# Patient Record
Sex: Female | Born: 1990 | Race: Black or African American | Hispanic: No | Marital: Single | Smoking: Former smoker
Health system: Southern US, Community
[De-identification: ages and names within clinical notes are randomized; demographics above are authoritative.]

## PROBLEM LIST (undated history)

## (undated) DIAGNOSIS — E041 Nontoxic single thyroid nodule: Secondary | ICD-10-CM

## (undated) DIAGNOSIS — Z803 Family history of malignant neoplasm of breast: Secondary | ICD-10-CM

## (undated) DIAGNOSIS — C50912 Malignant neoplasm of unspecified site of left female breast: Secondary | ICD-10-CM

## (undated) DIAGNOSIS — E229 Hyperfunction of pituitary gland, unspecified: Secondary | ICD-10-CM

## (undated) DIAGNOSIS — N921 Excessive and frequent menstruation with irregular cycle: Secondary | ICD-10-CM

## (undated) DIAGNOSIS — F419 Anxiety disorder, unspecified: Secondary | ICD-10-CM

## (undated) HISTORY — DX: Nontoxic single thyroid nodule: E04.1

## (undated) HISTORY — DX: Family history of malignant neoplasm of breast: Z80.3

## (undated) HISTORY — PX: MANDIBLE SURGERY: SHX707

## (undated) HISTORY — DX: Excessive and frequent menstruation with irregular cycle: N92.1

## (undated) HISTORY — DX: Anxiety disorder, unspecified: F41.9

## (undated) HISTORY — DX: Hyperfunction of pituitary gland, unspecified: E22.9

---

## 2008-07-04 ENCOUNTER — Emergency Department (HOSPITAL_BASED_OUTPATIENT_CLINIC_OR_DEPARTMENT_OTHER): Admission: EM | Admit: 2008-07-04 | Discharge: 2008-07-04 | Payer: Self-pay | Admitting: Emergency Medicine

## 2008-10-06 ENCOUNTER — Emergency Department (HOSPITAL_BASED_OUTPATIENT_CLINIC_OR_DEPARTMENT_OTHER): Admission: EM | Admit: 2008-10-06 | Discharge: 2008-10-06 | Payer: Self-pay | Admitting: Emergency Medicine

## 2009-05-24 ENCOUNTER — Encounter: Payer: Self-pay | Admitting: Emergency Medicine

## 2009-05-24 ENCOUNTER — Ambulatory Visit: Payer: Self-pay | Admitting: Diagnostic Radiology

## 2009-05-24 ENCOUNTER — Ambulatory Visit (HOSPITAL_COMMUNITY): Admission: EM | Admit: 2009-05-24 | Discharge: 2009-05-25 | Payer: Self-pay | Admitting: Emergency Medicine

## 2009-07-08 ENCOUNTER — Ambulatory Visit (HOSPITAL_COMMUNITY): Admission: RE | Admit: 2009-07-08 | Discharge: 2009-07-08 | Payer: Self-pay | Admitting: Otolaryngology

## 2009-07-26 ENCOUNTER — Ambulatory Visit (HOSPITAL_COMMUNITY): Admission: RE | Admit: 2009-07-26 | Discharge: 2009-07-26 | Payer: Self-pay | Admitting: Otolaryngology

## 2010-02-06 ENCOUNTER — Emergency Department (HOSPITAL_BASED_OUTPATIENT_CLINIC_OR_DEPARTMENT_OTHER)
Admission: EM | Admit: 2010-02-06 | Discharge: 2010-02-07 | Payer: Self-pay | Source: Home / Self Care | Admitting: Emergency Medicine

## 2010-05-12 LAB — CBC
Hemoglobin: 11.7 g/dL — ABNORMAL LOW (ref 12.0–15.0)
Platelets: 226 10*3/uL (ref 150–400)
RBC: 4.61 MIL/uL (ref 3.87–5.11)

## 2010-06-03 LAB — URINALYSIS, ROUTINE W REFLEX MICROSCOPIC
Hgb urine dipstick: NEGATIVE
Protein, ur: NEGATIVE mg/dL
pH: 6 (ref 5.0–8.0)
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
## 2012-02-24 DIAGNOSIS — R7989 Other specified abnormal findings of blood chemistry: Secondary | ICD-10-CM

## 2012-02-24 DIAGNOSIS — E041 Nontoxic single thyroid nodule: Secondary | ICD-10-CM

## 2012-02-24 HISTORY — DX: Other specified abnormal findings of blood chemistry: R79.89

## 2012-02-24 HISTORY — DX: Nontoxic single thyroid nodule: E04.1

## 2015-03-15 ENCOUNTER — Emergency Department (HOSPITAL_BASED_OUTPATIENT_CLINIC_OR_DEPARTMENT_OTHER)
Admission: EM | Admit: 2015-03-15 | Discharge: 2015-03-15 | Disposition: A | Discharge status: Home / Self Care | Payer: Self-pay | Attending: Emergency Medicine | Admitting: Emergency Medicine

## 2015-03-15 ENCOUNTER — Encounter (HOSPITAL_BASED_OUTPATIENT_CLINIC_OR_DEPARTMENT_OTHER): Payer: Self-pay | Admitting: Emergency Medicine

## 2015-03-15 ENCOUNTER — Emergency Department (HOSPITAL_BASED_OUTPATIENT_CLINIC_OR_DEPARTMENT_OTHER): Payer: Self-pay

## 2015-03-15 DIAGNOSIS — F17200 Nicotine dependence, unspecified, uncomplicated: Secondary | ICD-10-CM | POA: Insufficient documentation

## 2015-03-15 DIAGNOSIS — J029 Acute pharyngitis, unspecified: Secondary | ICD-10-CM | POA: Insufficient documentation

## 2015-03-15 DIAGNOSIS — R112 Nausea with vomiting, unspecified: Secondary | ICD-10-CM | POA: Insufficient documentation

## 2015-03-15 DIAGNOSIS — J45909 Unspecified asthma, uncomplicated: Secondary | ICD-10-CM | POA: Insufficient documentation

## 2015-03-15 DIAGNOSIS — H73893 Other specified disorders of tympanic membrane, bilateral: Secondary | ICD-10-CM | POA: Insufficient documentation

## 2015-03-15 DIAGNOSIS — R6889 Other general symptoms and signs: Secondary | ICD-10-CM

## 2015-03-15 DIAGNOSIS — H04203 Unspecified epiphora, bilateral lacrimal glands: Secondary | ICD-10-CM | POA: Insufficient documentation

## 2015-03-15 LAB — RAPID STREP SCREEN (MED CTR MEBANE ONLY): STREPTOCOCCUS, GROUP A SCREEN (DIRECT): NEGATIVE

## 2015-03-15 MED ORDER — ALBUTEROL SULFATE HFA 108 (90 BASE) MCG/ACT IN AERS
2.0000 | INHALATION_SPRAY | RESPIRATORY_TRACT | Status: DC | PRN
  Administered 2015-03-15: 2 via RESPIRATORY_TRACT
  Filled 2015-03-15: qty 6.7

## 2015-03-15 MED ORDER — PROMETHAZINE-DM 6.25-15 MG/5ML PO SYRP: 5.0000 mL | ORAL_SOLUTION | Freq: Four times a day (QID) | ORAL | Status: DC | PRN

## 2015-03-15 MED ORDER — GUAIFENESIN 100 MG/5ML PO LIQD: 100.0000 mg | ORAL | Status: DC | PRN

## 2015-03-15 NOTE — Discharge Instructions (Signed)
Viral Infections °A viral infection can be caused by different types of viruses. Most viral infections are not serious and resolve on their own. However, some infections may cause severe symptoms and may lead to further complications. °SYMPTOMS °Viruses can frequently cause: °· Minor sore throat. °· Aches and pains. °· Headaches. °· Runny nose. °· Different types of rashes. °· Watery eyes. °· Tiredness. °· Cough. °· Loss of appetite. °· Gastrointestinal infections, resulting in nausea, vomiting, and diarrhea. °These symptoms do not respond to antibiotics because the infection is not caused by bacteria. However, you might catch a bacterial infection following the viral infection. This is sometimes called a "superinfection." Symptoms of such a bacterial infection may include: °· Worsening sore throat with pus and difficulty swallowing. °· Swollen neck glands. °· Chills and a high or persistent fever. °· Severe headache. °· Tenderness over the sinuses. °· Persistent overall ill feeling (malaise), muscle aches, and tiredness (fatigue). °· Persistent cough. °· Yellow, green, or brown mucus production with coughing. °HOME CARE INSTRUCTIONS  °· Only take over-the-counter or prescription medicines for pain, discomfort, diarrhea, or fever as directed by your caregiver. °· Drink enough water and fluids to keep your urine clear or pale yellow. Sports drinks can provide valuable electrolytes, sugars, and hydration. °· Get plenty of rest and maintain proper nutrition. Soups and broths with crackers or rice are fine. °SEEK IMMEDIATE MEDICAL CARE IF:  °· You have severe headaches, shortness of breath, chest pain, neck pain, or an unusual rash. °· You have uncontrolled vomiting, diarrhea, or you are unable to keep down fluids. °· You or your child has an oral temperature above 102° F (38.9° C), not controlled by medicine. °· Your baby is older than 3 months with a rectal temperature of 102° F (38.9° C) or higher. °· Your baby is 3  months old or younger with a rectal temperature of 100.4° F (38° C) or higher. °MAKE SURE YOU:  °· Understand these instructions. °· Will watch your condition. °· Will get help right away if you are not doing well or get worse. °  °This information is not intended to replace advice given to you by your health care provider. Make sure you discuss any questions you have with your health care provider. °  °Document Released: 11/19/2004 Document Revised: 05/04/2011 Document Reviewed: 07/18/2014 °Elsevier Interactive Patient Education ©2016 [employer] ° °

## 2015-03-15 NOTE — ED Provider Notes (Signed)
CSN: [id number]     Arrival date & time 03/15/15  1600 History   First MD Initiated Contact with Patient 03/15/15 1713     Chief Complaint  Patient presents with  . Cough     (Consider location/radiation/quality/duration/timing/severity/associated sxs/prior Treatment) HPI    25 year old female presents complaining of cold and fu like sxs. For the past 6 days pt has had watery eyes, runny nose, sore throat, dysphagia, productive cough, nausea, vomited once today and having subjective fever.  Cough has been persistent with blood streak in mucous.  Pt is a smoker, no recent travel.  Hx of asthma but has not need inhaler x 5 years but report having mild wheezing.  Has tried DayQuil and Nyquil with some improvement.  Sick contact at work.  No flu shot.      History reviewed. No pertinent past medical history. History reviewed. No pertinent past surgical history. History reviewed. No pertinent family history. Social History  Substance Use Topics  . Smoking status: Current Every Day Smoker  . Smokeless tobacco: None  . Alcohol Use: Yes     Comment: occ   OB History    No data available     Review of Systems  All other systems reviewed and are negative.     Allergies  Review of patient's allergies indicates no known allergies.  Home Medications   Prior to Admission medications   Not on File   BP 138/82 mmHg  Pulse 92  Temp(Src) 98.5 F (36.9 C) (Oral)  Resp 18  Ht 5\' 8"  (1.727 m)  Wt 96.163 kg  BMI 32.24 kg/m2  SpO2 100%  LMP 02/15/2015 Physical Exam  Constitutional: She appears well-developed and well-nourished. No distress.  HENT:  Head: Atraumatic.   Ears: right TM is normal, left TM is mildly erythematous and mildly bulging  Nose: normal nares  Throat: uvula is midline no tonsillar enlargement or exudates , no trismus  Eyes: Conjunctivae are normal.  Neck: Neck supple.  Cardiovascular: Normal rate and regular rhythm.   Pulmonary/Chest: Effort normal and  breath sounds normal.   Decreased breath sounds without any obvious wheezes, rales, or rhonchi heard  Abdominal: Soft. There is no tenderness.  Lymphadenopathy:    She has no cervical adenopathy.  Neurological: She is alert.  Skin: No rash noted.  Psychiatric: She has a normal mood and affect.  Nursing note and vitals reviewed.   ED Course  Procedures (including critical care time) Labs Review Labs Reviewed  RAPID STREP SCREEN (NOT AT [facility])  CULTURE, GROUP A STREP [facility])    Imaging Review Dg Chest 2 View  03/15/2015  CLINICAL DATA:  Productive cough and congestion for 1 week, initial encounter EXAM: CHEST - 2 VIEW COMPARISON:  None. FINDINGS: The heart size and mediastinal contours are within normal limits. Both lungs are clear. The visualized skeletal structures are unremarkable. IMPRESSION: No active disease. Electronically Signed   By: Inez Catalina M.D.   On: 03/15/2015 17:44   I have personally reviewed and evaluated these images and lab results as part of my medical decision-making.   EKG Interpretation None      MDM   Final diagnoses:  Flu-like symptoms    BP 138/82 mmHg  Pulse 93  Temp(Src) 98.5 F (36.9 C) (Oral)  Resp 18  Ht 5\' 8"  (1.727 m)  Wt 96.163 kg  BMI 32.24 kg/m2  SpO2 100%  LMP 02/15/2015   5:34 PM  patient presents with flulike symptoms. She is afebrile with  stable normal vital sign and no evidence of hypoxia. A rapid strep test is negative. Chest x-ray ordered. Albuterol inhaler provided.  6:08 PM  strep test is negative , chest x-ray shows no signs of pneumonia. Likely viral. Symptomatically treatment provided.  Domenic Moras, PA-C 03/15/15 1811  Noemi Chapel, MD 03/15/15 [phone]

## 2015-03-15 NOTE — ED Notes (Signed)
Patient has a had cough and congestion x 1 week. Reports this am that she has blood in her sputum and this was concerning to her

## 2015-03-15 NOTE — ED Notes (Signed)
Patient is alert and oriented x3.  She was given DC instructions and follow up visit instructions.  Patient gave verbal understanding. She was DC ambulatory under her own power to home.  V/S stable.  He was not showing any signs of distress on DC 

## 2015-03-18 LAB — CULTURE, GROUP A STREP (THRC)

## 2015-03-24 ENCOUNTER — Encounter (HOSPITAL_BASED_OUTPATIENT_CLINIC_OR_DEPARTMENT_OTHER): Payer: Self-pay | Admitting: Emergency Medicine

## 2015-03-24 ENCOUNTER — Emergency Department (HOSPITAL_BASED_OUTPATIENT_CLINIC_OR_DEPARTMENT_OTHER)
Admission: EM | Admit: 2015-03-24 | Discharge: 2015-03-24 | Disposition: A | Discharge status: Home / Self Care | Payer: Self-pay | Attending: Emergency Medicine | Admitting: Emergency Medicine

## 2015-03-24 DIAGNOSIS — R110 Nausea: Secondary | ICD-10-CM | POA: Insufficient documentation

## 2015-03-24 DIAGNOSIS — R21 Rash and other nonspecific skin eruption: Secondary | ICD-10-CM | POA: Insufficient documentation

## 2015-03-24 DIAGNOSIS — F17200 Nicotine dependence, unspecified, uncomplicated: Secondary | ICD-10-CM | POA: Insufficient documentation

## 2015-03-24 MED ORDER — CETIRIZINE HCL 10 MG PO TABS: 10.0000 mg | ORAL_TABLET | Freq: Every day | ORAL | Status: DC

## 2015-03-24 MED ORDER — PREDNISONE 50 MG PO TABS
50.0000 mg | ORAL_TABLET | Freq: Every day | ORAL | Status: DC
Start: 2015-03-24 — End: 2015-10-20

## 2015-03-24 NOTE — Discharge Instructions (Signed)
Return to the ED with any concerns including lip or tongue swelling, difficulty breathing, vomiting and not able to keep down liquids, or any other alarming symptoms

## 2015-03-24 NOTE — ED Notes (Addendum)
Pt states was seen and dx'd with flu like symptoms last Friday.  Took promethazine as rx'd mon tues and weds, stopped it on weds b/c she "felt funny."  Then on Thursday she started breaking out in a rash which has been worsening over course of the week.  Raised itchy rash over diffuse body.  Pt took benadryl earlier in the week with no relief of symptoms.

## 2015-03-24 NOTE — ED Provider Notes (Signed)
CSN: [id number]     Arrival date & time 03/24/15  1728 History  By signing my name below, I, Caroline Matthews, attest that this documentation has been prepared under the direction and in the presence of Alfonzo Beers, MD. Electronically Signed: Soijett Matthews, ED Scribe. 03/24/2015. 7:25 PM.   Chief Complaint  Patient presents with  . Rash      Patient is a 25 y.o. female presenting with rash. The history is provided by the patient. No language interpreter was used.  Rash Location:  Full body Quality: itchiness   Duration:  4 days Timing:  Constant Progression:  Worsening Chronicity:  New Context: not animal contact, not new detergent/soap and not plant contact   Relieved by:  Nothing Worsened by:  Nothing tried Ineffective treatments:  Antihistamines Associated symptoms: nausea   Associated symptoms: no shortness of breath, no sore throat and not vomiting     Caroline Matthews is a 25 y.o. female who presents to the Emergency Department complaining of worsening, pruritic, generalized rash to the body onset 3 days. Pt denies new soaps/pets/environment/lotion/detergent/food. She notes that she was seen on 03/15/15 for a cough and had a negative strep and CXR. Pt was Rx robitussin and promethazine. She states that she d/c use of the promethazine after taking it for 3 days and  "feeling funny." Pt is having associated symptoms of nausea. She notes that she has tried benadryl with no relief of her symptoms. She denies SOB, tongue swelling, sore throat, vomiting, and any other symptoms. She denies anyone near her having a rash at this time.   History reviewed. No pertinent past medical history. History reviewed. No pertinent past surgical history. No family history on file. Social History  Substance Use Topics  . Smoking status: Current Every Day Smoker  . Smokeless tobacco: None  . Alcohol Use: Yes     Comment: occ   OB History    No data available     Review of Systems  HENT: Negative  for sore throat.   Respiratory: Negative for shortness of breath.   Gastrointestinal: Positive for nausea. Negative for vomiting.  Skin: Positive for rash.  All other systems reviewed and are negative.     Allergies  Review of patient's allergies indicates no known allergies.  Home Medications   Prior to Admission medications   Medication Sig Start Date End Date Taking? Authorizing Provider  cetirizine (ZYRTEC) 10 MG tablet Take 1 tablet (10 mg total) by mouth daily. 03/24/15   Alfonzo Beers, MD  guaiFENesin (ROBITUSSIN) 100 MG/5ML liquid Take 5-10 mLs (100-200 mg total) by mouth every 4 (four) hours as needed for congestion. 03/15/15   Domenic Moras, PA-C  predniSONE (DELTASONE) 50 MG tablet Take 1 tablet (50 mg total) by mouth daily. 03/24/15   Alfonzo Beers, MD  promethazine-dextromethorphan (PROMETHAZINE-DM) 6.25-15 MG/5ML syrup Take 5 mLs by mouth 4 (four) times daily as needed for cough. 03/15/15   Domenic Moras, PA-C   BP 132/69 mmHg  Pulse 69  Temp(Src) 98.2 F (36.8 C) (Oral)  Resp 18  Ht 5\' 8"  (1.727 m)  Wt 219 lb 3.2 oz (99.428 kg)  BMI 33.34 kg/m2  SpO2 100%  LMP 03/22/2015 (Exact Date)  Vitals reviewed Physical Exam  Physical Examination: General appearance - alert, well appearing, and in no distress Mental status - alert, oriented to person, place, and time Eyes - no conjunctival injection, no scleral icterus Mouth - mucous membranes moist, pharynx normal without lesions Chest - clear to auscultation,  no wheezes, rales or rhonchi, symmetric air entry Heart - normal rate, regular rhythm, normal S1, S2, no murmurs, rubs, clicks or gallops Neurological - alert, oriented, normal speech Extremities - peripheral pulses normal, no pedal edema, no clubbing or cyanosis Skin - normal coloration and turgor, diffuse erythematous rash over arms, trunk, face, legs, no petechiae  ED Course  Procedures (including critical care time) DIAGNOSTIC STUDIES: Oxygen Saturation is 100% on  RA, nl by my interpretation.    COORDINATION OF CARE: 7:24 PM Discussed treatment plan with pt at bedside which includes continue benadryl PRN, zyrtec, steroid Rx and pt agreed to plan.    Labs Review Labs Reviewed - No data to display  Imaging Review No results found.    EKG Interpretation None      MDM   Final diagnoses:  Rash and nonspecific skin eruption    Pt presenting with diffuse itching rash over extremities, trunk, face.  Appearance is c/w hives- pt states benadryl did not help her symptoms.  Advised to continue benadryl every 6 hours, added zyrtec and 5 days course of predisone.  Discharged with strict return precautions.  Pt agreeable with plan.  I personally performed the services described in this documentation, which was scribed in my presence. The recorded information has been reviewed and is accurate.     Alfonzo Beers, MD 03/24/15 2144

## 2015-10-20 ENCOUNTER — Encounter (HOSPITAL_COMMUNITY): Payer: Self-pay | Admitting: Emergency Medicine

## 2015-10-20 ENCOUNTER — Emergency Department (HOSPITAL_COMMUNITY)
Admission: EM | Admit: 2015-10-20 | Discharge: 2015-10-20 | Disposition: A | Discharge status: Home / Self Care | Payer: Self-pay | Attending: Emergency Medicine | Admitting: Emergency Medicine

## 2015-10-20 DIAGNOSIS — R202 Paresthesia of skin: Secondary | ICD-10-CM | POA: Insufficient documentation

## 2015-10-20 DIAGNOSIS — N63 Unspecified lump in breast: Secondary | ICD-10-CM | POA: Insufficient documentation

## 2015-10-20 DIAGNOSIS — M25562 Pain in left knee: Secondary | ICD-10-CM | POA: Insufficient documentation

## 2015-10-20 DIAGNOSIS — N6323 Unspecified lump in the left breast, lower outer quadrant: Secondary | ICD-10-CM

## 2015-10-20 DIAGNOSIS — F17200 Nicotine dependence, unspecified, uncomplicated: Secondary | ICD-10-CM | POA: Insufficient documentation

## 2015-10-20 LAB — I-STAT CHEM 8, ED
BUN: 9 mg/dL (ref 6–20)
CALCIUM ION: 1.28 mmol/L (ref 1.13–1.30)
CREATININE: 0.4 mg/dL — AB (ref 0.44–1.00)
Chloride: 106 mmol/L (ref 101–111)
Glucose, Bld: 112 mg/dL — ABNORMAL HIGH (ref 65–99)
HCT: 36 % (ref 36.0–46.0)
HEMOGLOBIN: 12.2 g/dL (ref 12.0–15.0)
Potassium: 3.4 mmol/L — ABNORMAL LOW (ref 3.5–5.1)
Sodium: 140 mmol/L (ref 135–145)
TCO2: 22 mmol/L (ref 0–100)

## 2015-10-20 LAB — CBC WITH DIFFERENTIAL/PLATELET
BASOS PCT: 0 %
Basophils Absolute: 0 10*3/uL (ref 0.0–0.1)
EOS ABS: 0.2 10*3/uL (ref 0.0–0.7)
EOS PCT: 2 %
HCT: 32.7 % — ABNORMAL LOW (ref 36.0–46.0)
Hemoglobin: 11 g/dL — ABNORMAL LOW (ref 12.0–15.0)
Lymphocytes Relative: 48 %
Lymphs Abs: 4.6 10*3/uL — ABNORMAL HIGH (ref 0.7–4.0)
MCH: 23.8 pg — ABNORMAL LOW (ref 26.0–34.0)
MCHC: 33.6 g/dL (ref 30.0–36.0)
MCV: 70.8 fL — ABNORMAL LOW (ref 78.0–100.0)
MONO ABS: 0.9 10*3/uL (ref 0.1–1.0)
MONOS PCT: 10 %
Neutro Abs: 3.8 10*3/uL (ref 1.7–7.7)
Neutrophils Relative %: 40 %
PLATELETS: 227 10*3/uL (ref 150–400)
RBC: 4.62 MIL/uL (ref 3.87–5.11)
RDW: 15.2 % (ref 11.5–15.5)
WBC: 9.5 10*3/uL (ref 4.0–10.5)

## 2015-10-20 LAB — CBG MONITORING, ED: Glucose-Capillary: 119 mg/dL — ABNORMAL HIGH (ref 65–99)
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
## 2015-10-20 NOTE — Discharge Instructions (Signed)
Please take ibuprofen/tylenol for your pain as needed.  Follow up with the [facility] tomorrow for breast ultrasound for further evaluation of your left breast pain.  Use resources in this discharge package to find a primary provider for further evaluation of the tingling sensations in your hands/feet.  Your labs today are normal.  No evidence of diabetes

## 2015-10-20 NOTE — ED Notes (Signed)
Discharge instructions and follow up care reviewed with patient. Patient verbalized understanding. 

## 2015-10-20 NOTE — ED Triage Notes (Signed)
Pt from home with complaints of a lump in her right breast that has been causing her pain for about 1 week. Pt also states that she has been feeling "heavy and run down" for about a week as well. Pt reports decreased sensation in her hands and feet  Pt also has complaints of left knee pain. She states she turned and felt a pop in her knee. Pt is able to ambulate on the knee

## 2016-10-01 ENCOUNTER — Encounter (HOSPITAL_COMMUNITY): Payer: Self-pay

## 2016-10-01 DIAGNOSIS — F17200 Nicotine dependence, unspecified, uncomplicated: Secondary | ICD-10-CM | POA: Insufficient documentation

## 2016-10-01 DIAGNOSIS — N6320 Unspecified lump in the left breast, unspecified quadrant: Secondary | ICD-10-CM | POA: Insufficient documentation

## 2016-10-01 DIAGNOSIS — N6452 Nipple discharge: Secondary | ICD-10-CM | POA: Insufficient documentation

## 2016-10-01 NOTE — ED Triage Notes (Signed)
Breast lump and discharge from nipple from left breast, family hx of breast cancer.

## 2016-10-02 ENCOUNTER — Emergency Department (HOSPITAL_COMMUNITY)
Admission: EM | Admit: 2016-10-02 | Discharge: 2016-10-02 | Disposition: A | Discharge status: Home / Self Care | Payer: Self-pay | Attending: Emergency Medicine | Admitting: Emergency Medicine

## 2016-10-02 DIAGNOSIS — N6320 Unspecified lump in the left breast, unspecified quadrant: Secondary | ICD-10-CM

## 2016-10-02 DIAGNOSIS — N6452 Nipple discharge: Secondary | ICD-10-CM

## 2016-10-02 NOTE — ED Provider Notes (Signed)
[facility] DEPT Provider Note   CSN: [id number] Arrival date & time: 10/01/16  2328     History   Chief Complaint Chief Complaint  Patient presents with  . Breast Mass  . Breast Discharge    HPI Caroline Matthews is a 26 y.o. female who presents to the emergency department with a chief complaint of a mass to the left breast and brown discharge. The patient reports that she noticed a new mass to the left breast, around the 5-6 o'clock area last night. She reports a h/o of several smaller masses, but has not has an ultrasound of the breast performed. She reports ongoing dark brown discharge of the breast that began several months ago. She reports she was sitting on her couch several nights ago and notice the discharge began spontaneously. No right breast complaints. No fever no chills. No purulent drainage from the breast. No treatment prior to arrival. She reports a history of breast cancer and reports the youngest person her family who was diagnosed was 40. No pertinent past medical history.  The history is provided by the patient. No language interpreter was used.    History reviewed. No pertinent past medical history.  There are no active problems to display for this patient.   History reviewed. No pertinent surgical history.  OB History    No data available       Home Medications    Prior to Admission medications   Not on File    Family History History reviewed. No pertinent family history.  Social History Social History  Substance Use Topics  . Smoking status: Current Every Day Smoker  . Smokeless tobacco: Never Used  . Alcohol use Yes     Comment: occ     Allergies   Patient has no known allergies.   Review of Systems Review of Systems  Constitutional: Negative for activity change, chills and fever.  Respiratory: Negative for shortness of breath.   Cardiovascular: Negative for chest pain.  Gastrointestinal: Negative for abdominal pain.    Musculoskeletal: Negative for back pain and myalgias.       Left breast mass  Left breast discharge  Skin: Negative for rash.   Physical Exam Updated Vital Signs BP (!) 138/95 (BP Location: Right Arm)   Pulse 72   Temp 98 F (36.7 C) (Oral)   Resp 18   SpO2 100%   Physical Exam  Constitutional: No distress.  HENT:  Head: Normocephalic.  Eyes: Conjunctivae are normal.  Neck: Neck supple.  Cardiovascular: Normal rate and regular rhythm.  Exam reveals no gallop and no friction rub.   No murmur heard. Pulmonary/Chest: Effort normal. No respiratory distress.  Chaperoned exam. There is a 1 cm mass that is slightly fluctuant and firm that is not fixed to the underlying chest wall in the 5-6 o'clock area of the left breast. There is a 2 cm mass that does not appear fixed to the underlying tissue in the 8 o'clock area. Two smaller masses that appear fibrocystic in nature are located around the edge of the area in the 3 o'clock and 10 o'clock space.  Minimal brown discharge is expressed from the left breast.   Abdominal: Soft. She exhibits no distension.  Neurological: She is alert.  Skin: Skin is warm. No rash noted.  Psychiatric: Her behavior is normal.  Nursing note and vitals reviewed.    ED Treatments / Results  Labs (all labs ordered are listed, but only abnormal results are displayed) Labs Reviewed -  No data to display  EKG  EKG Interpretation None       Radiology No results found.  Procedures Procedures (including critical care time)  Medications Ordered in ED Medications - No data to display   Initial Impression / Assessment and Plan / ED Course  I have reviewed the triage vital signs and the nursing notes.  Pertinent labs & imaging results that were available during my care of the patient were reviewed by me and considered in my medical decision making (see chart for details).     26 year old female presenting with multiple masses to the left breast.  Minimal brown discharge is expressed from the left breast on exam. No fever or chills. The patient is a family history of breast cancer. No concern for mastoiditis at this time. Will discharge the patient to follow-up with the breast and cervical Center and get established with primary care at [facility]. Strict return precautions given. No acute distress. The patient is safe for discharge at this time.  Final Clinical Impressions(s) / ED Diagnoses   Final diagnoses:  Left breast mass  Discharge of breast    New Prescriptions There are no discharge medications for this patient.    Joanne Gavel, PA-C 10/03/16 0242    Jola Schmidt, MD 10/03/16 [phone]

## 2016-10-02 NOTE — Discharge Instructions (Signed)
Please call the breast and cervical Center tomorrow to schedule an appointment for a breast ultrasound. Please call [facility] to get established with primary care so the breast and cervical Center will have a place to send your follow-up results.  If you develop new or worsening symptoms including fever, chills, or if the left breast becomes red, hot, or swollen please return to the emergency department for reevaluation.

## 2016-10-07 ENCOUNTER — Ambulatory Visit: Payer: Self-pay | Attending: Family Medicine | Admitting: Family Medicine

## 2016-10-07 ENCOUNTER — Encounter: Payer: Self-pay | Admitting: Family Medicine

## 2016-10-07 VITALS — BP 122/82 | HR 64 | Temp 98.3°F | Ht 68.0 in | Wt 196.0 lb

## 2016-10-07 DIAGNOSIS — N6323 Unspecified lump in the left breast, lower outer quadrant: Secondary | ICD-10-CM | POA: Insufficient documentation

## 2016-10-07 NOTE — Progress Notes (Signed)
   Subjective:  Patient ID: Caroline Matthews, female    DOB: [birth year]  Age: 26 y.o. MRN: [id number]  CC: Breast Mass (left)   HPI Caroline Matthews presentsTo establish care after recently being seen at the ED for left breast lumps. She informs me that she has felt two left breast lumps for the last 3 months and she thinks they are getting harder. Lumps are tender all the time but more tender with her periods. She has noticed associated clear nipple discharge; right breast is asymptomatic. She denies history of oral contraceptive use. Endorses a positive history of breast cancer in her maternal grandmother at the age of 21.  No outpatient prescriptions prior to visit.   No facility-administered medications prior to visit.    No past medical history on file.  No past surgical history on file.  No Known Allergies   ROS Review of Systems  Constitutional: Negative for activity change, appetite change and fatigue.  HENT: Negative for congestion, sinus pressure and sore throat.   Eyes: Negative for visual disturbance.  Respiratory: Negative for cough, chest tightness, shortness of breath and wheezing.   Cardiovascular: Negative for chest pain and palpitations.  Gastrointestinal: Negative for abdominal distention, abdominal pain and constipation.  Endocrine: Negative for polydipsia.  Genitourinary: Negative for dysuria and frequency.  Musculoskeletal: Negative for arthralgias and back pain.  Skin: Negative for rash.  Neurological: Negative for tremors, light-headedness and numbness.  Hematological: Does not bruise/bleed easily.  Psychiatric/Behavioral: Negative for agitation and behavioral problems.    Objective:  BP 122/82   Pulse 64   Temp 98.3 F (36.8 C) (Oral)   Ht 5\' 8"  (1.727 m)   Wt 196 lb (88.9 kg)   LMP 09/14/2016   SpO2 100%   BMI 29.80 kg/m   BP/Weight 10/07/2016 10/02/2016 9/73/5329  Systolic BP 924 268 341  Diastolic BP 82 95 93  Wt. (Lbs) 196 - 191  BMI  29.8 - 29.04     Physical Exam  Constitutional: She is oriented to person, place, and time. She appears well-developed and well-nourished.  Cardiovascular: Normal rate, normal heart sounds and intact distal pulses.   No murmur heard. Pulmonary/Chest: Effort normal and breath sounds normal. She has no wheezes. She has no rales. She exhibits no tenderness. Right breast exhibits no mass, no nipple discharge and no tenderness. Left breast exhibits mass (Tender mobile mass at 4 o'clock), nipple discharge (clinical discharge) and tenderness.  Abdominal: Soft. Bowel sounds are normal. She exhibits no distension and no mass. There is no tenderness.  Musculoskeletal: Normal range of motion.  Lymphadenopathy:    She has no cervical adenopathy.  Neurological: She is alert and oriented to person, place, and time.     Assessment & Plan:   1. Breast lump on left side at 4 o'clock position Patient advised to apply for the confidence of discount We'll refer through [facility] as she has no medical coverage We'll follow-up next visit - MM DIAG BREAST TOMO BILATERAL; Future - US BREAST LTD UNI LEFT INC AXILLA; Future   No orders of the defined types were placed in this encounter.   Follow-up: Return in about 1 month (around 11/07/2016) for Follow-up left breast lump.   This note has been created with Surveyor, quantity. Any transcriptional errors are unintentional.     Arnoldo Morale MD

## 2017-02-23 DIAGNOSIS — N921 Excessive and frequent menstruation with irregular cycle: Secondary | ICD-10-CM

## 2017-02-23 HISTORY — DX: Excessive and frequent menstruation with irregular cycle: N92.1

## 2017-05-04 ENCOUNTER — Encounter (HOSPITAL_BASED_OUTPATIENT_CLINIC_OR_DEPARTMENT_OTHER): Payer: Self-pay | Admitting: *Deleted

## 2017-05-04 ENCOUNTER — Emergency Department (HOSPITAL_BASED_OUTPATIENT_CLINIC_OR_DEPARTMENT_OTHER)
Admission: EM | Admit: 2017-05-04 | Discharge: 2017-05-05 | Disposition: A | Discharge status: Home / Self Care | Payer: Self-pay | Attending: Emergency Medicine | Admitting: Emergency Medicine

## 2017-05-04 ENCOUNTER — Emergency Department (HOSPITAL_BASED_OUTPATIENT_CLINIC_OR_DEPARTMENT_OTHER): Payer: Self-pay

## 2017-05-04 ENCOUNTER — Other Ambulatory Visit: Payer: Self-pay

## 2017-05-04 DIAGNOSIS — R69 Illness, unspecified: Secondary | ICD-10-CM

## 2017-05-04 DIAGNOSIS — F17210 Nicotine dependence, cigarettes, uncomplicated: Secondary | ICD-10-CM | POA: Insufficient documentation

## 2017-05-04 DIAGNOSIS — J111 Influenza due to unidentified influenza virus with other respiratory manifestations: Secondary | ICD-10-CM | POA: Insufficient documentation

## 2017-05-04 MED ORDER — ACETAMINOPHEN 325 MG PO TABS
650.0000 mg | ORAL_TABLET | Freq: Once | ORAL | Status: AC
  Administered 2017-05-04: 650 mg via ORAL
  Filled 2017-05-04: qty 2

## 2017-05-04 MED ORDER — ONDANSETRON 4 MG PO TBDP
4.0000 mg | ORAL_TABLET | Freq: Once | ORAL | Status: AC
  Administered 2017-05-04: 4 mg via ORAL
  Filled 2017-05-04: qty 1

## 2017-05-04 NOTE — ED Provider Notes (Signed)
[facility] EMERGENCY DEPARTMENT Provider Note   CSN: [id number] Arrival date & time: 05/04/17  2237     History   Chief Complaint Chief Complaint  Patient presents with  . Flu symptoms    HPI Caroline Matthews is a 27 y.o. female Who presents for evaluation of generalized body aches, chills, nausea/vomiting/diarrhea that began 2 days ago.  Patient reports that she feels "achely all over" with no fical point.  Patient reports that she has had subjective fever chills but has not measured any temperature.  Patient reports she has been able to tolerate liquids but has had several episodes of nonbloody, nonbilious vomiting.  Patient reports no blood in the diarrhea.  Patient reports that she has not taken any medications for symptoms.  She states she has not had a flu shot this year.  Patient denies any chest pain, difficulty breathing, dysuria, hematuria.  The history is provided by the patient.    History reviewed. No pertinent past medical history.  There are no active problems to display for this patient.   History reviewed. No pertinent surgical history.  OB History    No data available       Home Medications    Prior to Admission medications   Medication Sig Start Date End Date Taking? Authorizing Provider  ondansetron (ZOFRAN) 4 MG tablet Take 1 tablet (4 mg total) by mouth every 6 (six) hours. 05/05/17   Volanda Napoleon, PA-C  oseltamivir (TAMIFLU) 75 MG capsule Take 1 capsule (75 mg total) by mouth every 12 (twelve) hours. 05/05/17   Volanda Napoleon, PA-C    Family History No family history on file.  Social History Social History   Tobacco Use  . Smoking status: Current Every Day Smoker  . Smokeless tobacco: Never Used  Substance Use Topics  . Alcohol use: Yes    Comment: occ  . Drug use: No     Allergies   Patient has no known allergies.   Review of Systems Review of Systems  Constitutional: Positive for chills, fatigue and fever.    HENT: Positive for congestion and rhinorrhea.   Respiratory: Positive for cough. Negative for shortness of breath.   Cardiovascular: Negative for chest pain.  Gastrointestinal: Positive for diarrhea, nausea and vomiting. Negative for abdominal pain.  Genitourinary: Negative for dysuria and hematuria.  Musculoskeletal: Positive for myalgias. Negative for back pain and neck pain.  Skin: Negative for rash.  Neurological: Negative for dizziness, weakness, numbness and headaches.     Physical Exam Updated Vital Signs BP (!) 124/94 (BP Location: Right Arm)   Pulse 84   Temp 99.5 F (37.5 C) (Oral)   Resp 19   Ht 5\' 8"  (1.727 m)   Wt 96.2 kg (212 lb)   LMP 04/14/2017   SpO2 99%   BMI 32.23 kg/m   Physical Exam  Constitutional: She is oriented to person, place, and time. She appears well-developed and well-nourished.  HENT:  Head: Normocephalic and atraumatic.  Nose: Mucosal edema and rhinorrhea present.  Mouth/Throat: Uvula is midline and mucous membranes are normal. No trismus in the jaw. Posterior oropharyngeal erythema present.  Eyes: Conjunctivae, EOM and lids are normal. Pupils are equal, round, and reactive to light.  Neck: Full passive range of motion without pain.  Cardiovascular: Normal rate, regular rhythm, normal heart sounds and normal pulses. Exam reveals no gallop and no friction rub.  No murmur heard. Pulmonary/Chest: Effort normal and breath sounds normal.  No evidence of respiratory distress.  Able to speak in full sentences without difficulty.  Abdominal: Soft. Normal appearance. There is no tenderness. There is no rigidity, no guarding, no CVA tenderness and no tenderness at McBurney's point.  Soft, nondistended, nontender.  No CVA tenderness bilaterally.  No rigidity, guarding.  No tenderness to McBurney's point.  Musculoskeletal: Normal range of motion.  Neurological: She is alert and oriented to person, place, and time.  Skin: Skin is warm and dry. Capillary  refill takes less than 2 seconds.  Psychiatric: She has a normal mood and affect. Her speech is normal.  Nursing note and vitals reviewed.    ED Treatments / Results  Labs (all labs ordered are listed, but only abnormal results are displayed) Labs Reviewed - No data to display  EKG  EKG Interpretation None       Radiology Dg Chest 2 View  Result Date: 05/04/2017 CLINICAL DATA:  Cough x4 days. EXAM: CHEST - 2 VIEW COMPARISON:  03/15/2015 FINDINGS: The heart size and mediastinal contours are within normal limits. Minimal left basilar atelectasis. No overt pulmonary edema nor effusion. No pneumothorax. The visualized skeletal structures are unremarkable. IMPRESSION: No active cardiopulmonary disease. Electronically Signed   By: Ashley Royalty M.D.   On: 05/04/2017 23:22    Procedures Procedures (including critical care time)  Medications Ordered in ED Medications  ondansetron (ZOFRAN-ODT) disintegrating tablet 4 mg (4 mg Oral Given 05/04/17 2346)  acetaminophen (TYLENOL) tablet 650 mg (650 mg Oral Given 05/04/17 2346)  oseltamivir (TAMIFLU) capsule 75 mg (75 mg Oral Given 05/05/17 0010)     Initial Impression / Assessment and Plan / ED Course  I have reviewed the triage vital signs and the nursing notes.  Pertinent labs & imaging results that were available during my care of the patient were reviewed by me and considered in my medical decision making (see chart for details).     27 year old female who presents for evaluation of 2 days of generalized body aches, subjective fever chills, nasal congestion, rhinorrhea, cough.  Has been able to tolerate liquids but no p.o.  No focal abdominal pain. Patient is afebrile, non-toxic appearing, sitting comfortably on examination table.  Initial vitals show the patient is slightly tachycardic.  Otherwise unremarkable.  Consider pharyngitis versus upper respiratory infection versus influenza.  Exam is not concerning for pneumonia, appendicitis,  diverticulitis as patient does not have any focal abdominal tenderness.  It is not concerning for strep pharyngitis, peritonsillar abscess, Ludwig angina.  Suspect the symptoms are likely related to influenza.  We will plan to give patient analgesics here in the department, Zofran for nausea/vomiting.  Chest x-ray ordered at triage.   Chest x-ray reviewed.  Negative for any acute infectious etiology.  Discussed results with patient.  Patient was able to tolerate p.o. in the department without any difficulty.  No vomiting.  Repeat abdominal exam is benign as patient has no focal tenderness of the concerning for appendicitis, diverticulitis.  No rigidity, guarding.  Vital signs improved after analgesics.  Patient is symptoms started less than 48 hours ago and falls within the window of Tamiflu treatment.  I discussed risks and benefits of Tamiflu treatment patient would like to receive treatment at this time.  We will plan to send home with Zofran for symptomatic relief.  Instructed patient on supportive at home therapies. Patient had ample opportunity for questions and discussion. All patient's questions were answered with full understanding. Strict return precautions discussed. Patient expresses understanding and agreement to plan.    Final Clinical  Impressions(s) / ED Diagnoses   Final diagnoses:  Influenza-like illness    ED Discharge Orders        Ordered    oseltamivir (TAMIFLU) 75 MG capsule  Every 12 hours     05/05/17 0001    ondansetron (ZOFRAN) 4 MG tablet  Every 6 hours     05/05/17 0001       Volanda Napoleon, PA-C 05/05/17 0109    Merryl Hacker, MD 05/05/17 [phone]

## 2017-05-04 NOTE — ED Triage Notes (Signed)
Chills, body aches, diarrhea, headache, sore throat x 2 days. Has not taken meds for symptoms.

## 2017-05-04 NOTE — ED Notes (Signed)
ED Provider at bedside. 

## 2017-05-05 MED ORDER — OSELTAMIVIR PHOSPHATE 75 MG PO CAPS: 75.0000 mg | ORAL_CAPSULE | Freq: Two times a day (BID) | ORAL | 0 refills | Status: DC

## 2017-05-05 MED ORDER — ONDANSETRON HCL 4 MG PO TABS: 4.0000 mg | ORAL_TABLET | Freq: Four times a day (QID) | ORAL | 0 refills | Status: DC

## 2017-05-05 MED ORDER — OSELTAMIVIR PHOSPHATE 75 MG PO CAPS
75.0000 mg | ORAL_CAPSULE | Freq: Once | ORAL | Status: AC
  Administered 2017-05-05: 75 mg via ORAL
  Filled 2017-05-05: qty 1

## 2017-05-05 NOTE — Discharge Instructions (Signed)
You can take Tylenol or Ibuprofen as directed for pain. You can alternate Tylenol and Ibuprofen every 4 hours. If you take Tylenol at 1pm, then you can take Ibuprofen at 5pm. Then you can take Tylenol again at 9pm.   Take Tamiflu as directed. This may make you have some stomach upset and/or nausea/vomiting. I have provided zofran to help with that.   Make sure you are getting plenty of fluid and staying hydrated.   Follow-up with your primary care doctor in the next 24-48 hours for further evaluation.  Return to the emergency department for any worsening fever, chest pain, difficulty breathing, abdominal pain, vomiting, diarrhea, inability to eat or drink anything or any other worsening or concerning symptoms.

## 2017-05-24 HISTORY — PX: BREAST BIOPSY: SHX20

## 2017-06-02 ENCOUNTER — Other Ambulatory Visit: Payer: Self-pay

## 2017-06-02 ENCOUNTER — Other Ambulatory Visit (HOSPITAL_COMMUNITY)
Admission: RE | Admit: 2017-06-02 | Discharge: 2017-06-02 | Disposition: A | Discharge status: Home / Self Care | Payer: PRIVATE HEALTH INSURANCE | Source: Ambulatory Visit | Attending: Obstetrics and Gynecology | Admitting: Obstetrics and Gynecology

## 2017-06-02 ENCOUNTER — Ambulatory Visit: Payer: PRIVATE HEALTH INSURANCE | Admitting: Obstetrics and Gynecology

## 2017-06-02 ENCOUNTER — Encounter: Payer: Self-pay | Admitting: Obstetrics and Gynecology

## 2017-06-02 VITALS — BP 110/58 | HR 80 | Resp 16 | Ht 67.25 in | Wt 213.0 lb

## 2017-06-02 DIAGNOSIS — E229 Hyperfunction of pituitary gland, unspecified: Secondary | ICD-10-CM

## 2017-06-02 DIAGNOSIS — Z113 Encounter for screening for infections with a predominantly sexual mode of transmission: Secondary | ICD-10-CM | POA: Diagnosis not present

## 2017-06-02 DIAGNOSIS — E041 Nontoxic single thyroid nodule: Secondary | ICD-10-CM

## 2017-06-02 DIAGNOSIS — N921 Excessive and frequent menstruation with irregular cycle: Secondary | ICD-10-CM | POA: Diagnosis not present

## 2017-06-02 DIAGNOSIS — Z01419 Encounter for gynecological examination (general) (routine) without abnormal findings: Secondary | ICD-10-CM

## 2017-06-02 DIAGNOSIS — N6320 Unspecified lump in the left breast, unspecified quadrant: Secondary | ICD-10-CM

## 2017-06-02 DIAGNOSIS — R7989 Other specified abnormal findings of blood chemistry: Secondary | ICD-10-CM

## 2017-06-02 NOTE — Patient Instructions (Signed)

## 2017-06-02 NOTE — Progress Notes (Signed)
Scheduled patient while in office for bilateral diagnostic mammogram with left breast [facility] on 06[facility]07/2017 at 8:30 am. Patient declines earlier appointment. Placed in mammogram hold. Thyroid ultrasound scheduled for 4/15 at 2:30 pm at [facility]. Placed in imaging hold.

## 2017-06-02 NOTE — Progress Notes (Signed)
27 y.o. G0P0000 Single African American female here for annual exam. Patient complains of having discharge from left breast and "2 knots". Maternal FH of breast cancer.  States breast problems for the last 10 years.  Noticed 2 knots in left breast for 6 - 12 months.   Breast discharge for 10 years.  She can prompt this.  Milky orange color.  Bloody discharge on the left side.  States she has an elevated prolactin level. Did MRI of the brain which was normal. Done at [facility].   States thyroid problems also.  Told she has thyroid nodules.  Did an ultrasound and did not follow through with biopsy.  This was 5 years ago as well at [facility].  Feet and hands numb all the time.   Irregular menses. Menses every 12 - 21 days.  Flow is heavy with tampon change every hour.  Stains clothing and sheets. Menstrual cramps.  No ultrasound.   Desires STD testing today.   Frequent BMs.   Anxiety.  Feels jittery. Feels this way in large groups of people. Can prevent her from being with friends.  Can feel low.  Did see a therapist in 2016 in [place], [place].  PCP: No PCP    Patient's last menstrual period was 05/07/2017.     Period Cycle (Days): (irregular) Period Duration (Days): 4-5 Period Pattern: (!) Irregular Menstrual Flow: Heavy Menstrual Control: Tampon, Panty liner Menstrual Control Change Freq (Hours): 1 Dysmenorrhea: (!) Severe Dysmenorrhea Symptoms: Cramping, Nausea, Diarrhea(occasional vomiting, body aches)     Sexually active: Yes.    The current method of family planning is none -- female partner.    Exercising: No.  The patient does not participate in regular exercise at present. Smoker:  Yes, marijuana  Health Maintenance: Pap:  2016 done in [facility] -- normal per patient History of abnormal Pap:  no TDaP:  Not up to date Gardasil:   no HIV: negative in the past per patient Hep C: never Screening Labs:  Discuss  today   reports that she quit smoking about 4 weeks ago. Her smoking use included cigarettes. She has never used smokeless tobacco. She reports that she drinks alcohol. She reports that she has current or past drug history. Drug: Marijuana.  Past Medical History:  Diagnosis Date  . Anxiety     Past Surgical History:  Procedure Laterality Date  . MANDIBLE SURGERY      No current outpatient medications on file.   No current facility-administered medications for this visit.     Family History  Problem Relation Age of Onset  . Heart attack Father   . Breast cancer Maternal Aunt   . Breast cancer Maternal Grandmother     ROS:  Pertinent items are noted in HPI.  Otherwise, a comprehensive ROS was negative.  Exam:   BP (!) 110/58 (BP Location: Right Arm, Patient Position: Sitting, Cuff Size: Normal)   Pulse 80   Resp 16   Ht 5' 7.25" (1.708 m)   Wt 213 lb (96.6 kg)   LMP 05/07/2017   BMI 33.11 kg/m     General appearance: alert, cooperative and appears stated age Head: Normocephalic, without obvious abnormality, atraumatic Neck: no adenopathy, supple, symmetrical, trachea midline and thyroid normal to inspection and palpation Lungs: clear to auscultation bilaterally Breasts: right - normal appearance, no masses or tenderness, No nipple retraction or dimpling, No nipple discharge or bleeding, No axillary or supraclavicular adenopathy Left breast with 1 cm firm lump  and retraction at 2:00.  0.8 cm lump at 3:00 lateral to this.  Heart: regular rate and rhythm Abdomen: soft, non-tender; no masses, no organomegaly Extremities: extremities normal, atraumatic, no cyanosis or edema Skin: Skin color, texture, turgor normal. No rashes or lesions Lymph nodes: Cervical, supraclavicular, and axillary nodes normal. No abnormal inguinal nodes palpated Neurologic: Grossly normal  Pelvic: External genitalia:  no lesions              Urethra:  normal appearing urethra with no masses,  tenderness or lesions              Bartholins and Skenes: normal                 Vagina: normal appearing vagina with normal color and discharge, no lesions              Cervix: no lesions              Pap taken: Yes.   Bimanual Exam:  Uterus:  normal size, contour, position, consistency, mobility, non-tender              Adnexa: no mass, fullness, tenderness              Rectal exam: Yes.  .  Confirms.              Anus:  normal sphincter tone, no lesions  Chaperone was present for exam.  Assessment:   Well woman visit with GYN exam. Bloody nipple discharge left breast.  Left breast lumps.  FH breast cancer.  Thyroid nodules.  STD screening.  Hx elevated prolactin level. Menorrhagia with irregular menses.  Plan: Bilateral dx mammogram and left breast ultrasound.  Recommended self breast awareness. Pap and HR HPV as above. Guidelines for Calcium, Vitamin D, regular exercise program including cardiovascular and weight bearing exercise. Discussed Gardasil vaccine.  Declines.  STD screening.  Routine labs with full TFTs, prolactin.  Return for pelvic ultrasound.  Thyroid ultrasound.  Follow up annually and prn.   After visit summary provided.

## 2017-06-03 ENCOUNTER — Telehealth: Payer: Self-pay | Admitting: *Deleted

## 2017-06-03 DIAGNOSIS — N6320 Unspecified lump in the left breast, unspecified quadrant: Secondary | ICD-10-CM | POA: Insufficient documentation

## 2017-06-03 DIAGNOSIS — N921 Excessive and frequent menstruation with irregular cycle: Secondary | ICD-10-CM | POA: Insufficient documentation

## 2017-06-03 LAB — COMPREHENSIVE METABOLIC PANEL
ALT: 13 IU/L (ref 0–32)
AST: 16 IU/L (ref 0–40)
Albumin/Globulin Ratio: 1.4 (ref 1.2–2.2)
Albumin: 4.2 g/dL (ref 3.5–5.5)
Alkaline Phosphatase: 42 IU/L (ref 39–117)
BUN/Creatinine Ratio: 14 (ref 9–23)
BUN: 10 mg/dL (ref 6–20)
Bilirubin Total: <0.2 mg/dL (ref 0.0–1.2)
CALCIUM: 9 mg/dL (ref 8.7–10.2)
CO2: 23 mmol/L (ref 20–29)
CREATININE: 0.73 mg/dL (ref 0.57–1.00)
Chloride: 104 mmol/L (ref 96–106)
GFR calc Af Amer: 130 mL/min/{1.73_m2} (ref >59)
GFR, EST NON AFRICAN AMERICAN: 112 mL/min/{1.73_m2} (ref >59)
Globulin, Total: 3 g/dL (ref 1.5–4.5)
Glucose: 83 mg/dL (ref 65–99)
Potassium: 4.1 mmol/L (ref 3.5–5.2)
Sodium: 139 mmol/L (ref 134–144)
Total Protein: 7.2 g/dL (ref 6.0–8.5)

## 2017-06-03 LAB — CYTOLOGY - PAP
Chlamydia: NEGATIVE
Diagnosis: NEGATIVE
NEISSERIA GONORRHEA: NEGATIVE
TRICH (WINDOWPATH): NEGATIVE

## 2017-06-03 LAB — LIPID PANEL
CHOLESTEROL TOTAL: 201 mg/dL — AB (ref 100–199)
Chol/HDL Ratio: 2.9 ratio (ref 0.0–4.4)
HDL: 70 mg/dL (ref >39)
LDL CALC: 119 mg/dL — AB (ref 0–99)
TRIGLYCERIDES: 58 mg/dL (ref 0–149)
VLDL Cholesterol Cal: 12 mg/dL (ref 5–40)

## 2017-06-03 LAB — THYROID PANEL WITH TSH
FREE THYROXINE INDEX: 2.2 (ref 1.2–4.9)
T3 UPTAKE RATIO: 32 % (ref 24–39)
T4, Total: 7 ug/dL (ref 4.5–12.0)
TSH: 0.778 u[IU]/mL (ref 0.450–4.500)

## 2017-06-03 LAB — CBC
HEMOGLOBIN: 11.7 g/dL (ref 11.1–15.9)
Hematocrit: 36.6 % (ref 34.0–46.6)
MCH: 27.4 pg (ref 26.6–33.0)
MCHC: 32 g/dL (ref 31.5–35.7)
MCV: 86 fL (ref 79–97)
Platelets: 209 10*3/uL (ref 150–379)
RBC: 4.27 x10E6/uL (ref 3.77–5.28)
RDW: 14.8 % (ref 12.3–15.4)
WBC: 6.9 10*3/uL (ref 3.4–10.8)

## 2017-06-03 LAB — HEP, RPR, HIV PANEL
HIV SCREEN 4TH GENERATION: NONREACTIVE
Hepatitis B Surface Ag: NEGATIVE
RPR Ser Ql: NONREACTIVE

## 2017-06-03 LAB — PROLACTIN: Prolactin: 11 ng/mL (ref 4.8–23.3)

## 2017-06-03 LAB — HEPATITIS C ANTIBODY

## 2017-06-03 NOTE — Telephone Encounter (Signed)
-----   Message from Nunzio Cobbs, MD sent at 06/03/2017  3:07 PM EDT ----- Please contact patient with results.   her LDL cholesterol was elevated, but the ratios of cholesterol overall are in an acceptable range.  She may lower your cholesterol through vigorous exercise most days of the week, a diet low in saturated fats and cholesterol, and even weight loss. Doing the cholesterol check in a fasting state may also lower the LDL number.   Infection testing is negative for HIV, syphilis, and hepatitis B and C.   The following are normal:   Thyroid Prolactin Complete blood counts Blood chemistries  Her pap and testing from that for infection are not back yet.

## 2017-06-03 NOTE — Telephone Encounter (Signed)
Spoke with patient, advised as seen below per Dr. Quincy Simmonds. Patient verbalizes understanding and is agreeable. Will close encounter.

## 2017-06-03 NOTE — Telephone Encounter (Signed)
Notes recorded by Burnice Logan, RN on 06/03/2017 at 4:06 PM EDT Unable to leave message, mailbox full.

## 2017-06-07 ENCOUNTER — Ambulatory Visit
Admission: RE | Admit: 2017-06-07 | Discharge: 2017-06-07 | Disposition: A | Discharge status: Home / Self Care | Payer: PRIVATE HEALTH INSURANCE | Source: Ambulatory Visit | Attending: Obstetrics and Gynecology | Admitting: Obstetrics and Gynecology

## 2017-06-07 ENCOUNTER — Other Ambulatory Visit: Payer: Self-pay | Admitting: Obstetrics and Gynecology

## 2017-06-07 ENCOUNTER — Ambulatory Visit: Payer: Self-pay

## 2017-06-07 DIAGNOSIS — N6320 Unspecified lump in the left breast, unspecified quadrant: Secondary | ICD-10-CM

## 2017-06-15 ENCOUNTER — Ambulatory Visit
Admission: RE | Admit: 2017-06-15 | Discharge: 2017-06-15 | Disposition: A | Discharge status: Home / Self Care | Payer: PRIVATE HEALTH INSURANCE | Source: Ambulatory Visit | Attending: Obstetrics and Gynecology | Admitting: Obstetrics and Gynecology

## 2017-06-15 ENCOUNTER — Other Ambulatory Visit: Payer: Self-pay | Admitting: Obstetrics and Gynecology

## 2017-06-15 DIAGNOSIS — N6320 Unspecified lump in the left breast, unspecified quadrant: Secondary | ICD-10-CM

## 2017-06-16 ENCOUNTER — Telehealth: Payer: Self-pay | Admitting: Hematology and Oncology

## 2017-06-16 ENCOUNTER — Telehealth: Payer: Self-pay | Admitting: Obstetrics and Gynecology

## 2017-06-16 NOTE — Telephone Encounter (Signed)
Spoke with patient regarding benefit for recommended ultrasound. Patient understood and agreeable. Patient ready to schedule. Patient scheduled 07/08/17 with Dr Quincy Simmonds, as requested by patient for financial reasons. Patient aware of appointment date, arrival time and cancellation policy. Patient had no further questions.   Will close encounter.  Forwarding to Dr Quincy Simmonds for final review

## 2017-06-16 NOTE — Telephone Encounter (Signed)
Unable to make contact with patient for afternoon [facility] appointment on 5/1, packet will be mailed to patient

## 2017-06-17 ENCOUNTER — Telehealth: Payer: Self-pay | Admitting: Obstetrics and Gynecology

## 2017-06-17 ENCOUNTER — Telehealth: Payer: Self-pay | Admitting: *Deleted

## 2017-06-17 DIAGNOSIS — C50412 Malignant neoplasm of upper-outer quadrant of left female breast: Secondary | ICD-10-CM | POA: Insufficient documentation

## 2017-06-17 DIAGNOSIS — Z170 Estrogen receptor positive status [ER+]: Principal | ICD-10-CM

## 2017-06-17 NOTE — Telephone Encounter (Signed)
Confirmed [facility] for 06/23/17 at 1230 .  Instructions and contact information given.

## 2017-06-17 NOTE — Telephone Encounter (Signed)
Phone call to give support following her recent diagnosis of breast cancer.  Voice mail is full, and I am unable to leave her a message.

## 2017-06-23 ENCOUNTER — Ambulatory Visit: Payer: PRIVATE HEALTH INSURANCE | Attending: General Surgery | Admitting: Physical Therapy

## 2017-06-23 ENCOUNTER — Ambulatory Visit
Admission: RE | Admit: 2017-06-23 | Discharge: 2017-06-23 | Disposition: A | Discharge status: Home / Self Care | Payer: PRIVATE HEALTH INSURANCE | Source: Ambulatory Visit | Attending: Radiation Oncology | Admitting: Radiation Oncology

## 2017-06-23 ENCOUNTER — Encounter: Payer: Self-pay | Admitting: *Deleted

## 2017-06-23 ENCOUNTER — Encounter: Payer: Self-pay | Admitting: Hematology and Oncology

## 2017-06-23 ENCOUNTER — Encounter: Payer: Self-pay | Admitting: Physical Therapy

## 2017-06-23 ENCOUNTER — Inpatient Hospital Stay: Payer: PRIVATE HEALTH INSURANCE | Attending: Hematology and Oncology | Admitting: Hematology and Oncology

## 2017-06-23 ENCOUNTER — Other Ambulatory Visit: Payer: Self-pay | Admitting: *Deleted

## 2017-06-23 ENCOUNTER — Other Ambulatory Visit: Payer: Self-pay

## 2017-06-23 DIAGNOSIS — Z170 Estrogen receptor positive status [ER+]: Secondary | ICD-10-CM | POA: Insufficient documentation

## 2017-06-23 DIAGNOSIS — C50412 Malignant neoplasm of upper-outer quadrant of left female breast: Secondary | ICD-10-CM

## 2017-06-23 DIAGNOSIS — Z87891 Personal history of nicotine dependence: Secondary | ICD-10-CM | POA: Diagnosis not present

## 2017-06-23 DIAGNOSIS — R293 Abnormal posture: Secondary | ICD-10-CM | POA: Insufficient documentation

## 2017-06-23 DIAGNOSIS — F419 Anxiety disorder, unspecified: Secondary | ICD-10-CM | POA: Diagnosis not present

## 2017-06-23 DIAGNOSIS — F1210 Cannabis abuse, uncomplicated: Secondary | ICD-10-CM | POA: Diagnosis not present

## 2017-06-23 NOTE — Therapy (Signed)
[place], [place], [address] Phone: [phone]   Fax:  [phone]  Physical Therapy Evaluation  Patient Details  Name: Caroline Matthews MRN: [id number] Date of Birth: 05-08-1990 Referring Provider: Dr. Rolm Bookbinder   Encounter Date: 06/23/2017  PT End of Session - 06/23/17 1654    Visit Number  1    Number of Visits  2    Date for PT Re-Evaluation  08/18/17    PT Start Time  1510    PT Stop Time  1542    PT Time Calculation (min)  32 min    Activity Tolerance  Patient tolerated treatment well    Behavior During Therapy  [facility] for tasks assessed/performed       Past Medical History:  Diagnosis Date  . Anxiety     Past Surgical History:  Procedure Laterality Date  . MANDIBLE SURGERY      There were no vitals filed for this visit.   Subjective Assessment - 06/23/17 1648    Subjective  Patient reports she is here today to be seen by her medical team for her newly diagnosed left breast cancer.    Patient is accompained by:  Family member    Pertinent History  Patient was diagnosed on 06/07/17 with left grade 1 invasive ductal carcinoma breast cancer. There are 3 areas in the left breast all in the upper outer quadrant measuring 1.9 cm, 1.5 cm, and 8 mm. They are ER/PR positive and HER2 negative with a Ki67 of 25%. There is a 9 cm area of calcifications.    Patient Stated Goals  Reduce lymphedema risk and learn post op shoulder ROM HEP    Currently in Pain?  No/denies         [facility] PT Assessment - 06/23/17 0001      Assessment   Medical Diagnosis  Left breast cancer    Referring Provider  Dr. Rolm Bookbinder    Onset Date/Surgical Date  06/07/17    Hand Dominance  Right    Prior Therapy  none      Precautions   Precautions  Other (comment)    Precaution Comments  active cancer      Restrictions   Weight Bearing Restrictions  No      Balance Screen   Has the patient fallen in  the past 6 months  No    Has the patient had a decrease in activity level because of a fear of falling?   No    Is the patient reluctant to leave their home because of a fear of falling?   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 [employer]  She does not exercise      Cognition   Overall Cognitive Status  Within Functional Limits for tasks assessed      Posture/Postural Control   Posture/Postural Control  Postural limitations    Postural Limitations  Rounded Shoulders;Forward head      ROM / Strength   AROM / PROM / Strength  AROM;Strength      AROM   AROM Assessment Site  Shoulder;Cervical  Right/Left Shoulder  Right;Left    Right Shoulder Extension  57 Degrees    Right Shoulder Flexion  155 Degrees    Right Shoulder ABduction  165 Degrees    Right Shoulder Internal Rotation  84 Degrees    Right Shoulder External Rotation  82 Degrees    Left Shoulder Extension  48 Degrees    Left Shoulder Flexion  141 Degrees    Left Shoulder ABduction  144 Degrees    Left Shoulder Internal Rotation  58 Degrees    Left Shoulder External Rotation  79 Degrees    Cervical Flexion  WNL    Cervical Extension  WNL    Cervical - Right Side Bend  WNL    Cervical - Left Side Bend  WNL    Cervical - Right Rotation  WNL    Cervical - Left Rotation  WNL      Strength   Overall Strength  Within functional limits for tasks performed        LYMPHEDEMA/ONCOLOGY QUESTIONNAIRE - 06/23/17 1652      Type   Cancer Type  Left breast cancer      Lymphedema Assessments   Lymphedema Assessments  Upper extremities      Right Upper Extremity Lymphedema   10 cm Proximal to Olecranon Process  31.8 cm    Olecranon Process  26.6 cm    10 cm Proximal to  Ulnar Styloid Process  23.3 cm    Just Proximal to Ulnar Styloid Process  16.8 cm    Across Hand at [employer]  19.2 cm    At [place] of 2nd Digit  6.8 cm      Left Upper Extremity Lymphedema   10 cm Proximal to Olecranon Process  32.7 cm    Olecranon Process  26.2 cm    10 cm Proximal to Ulnar Styloid Process  22.4 cm    Just Proximal to Ulnar Styloid Process  16.8 cm    Across Hand at [employer]  19.4 cm    At [place] of 2nd Digit  6.6 cm             Objective measurements completed on examination: See above findings.    Patient was instructed today in a home exercise program today for post op shoulder range of motion. These included active assist shoulder flexion in sitting, scapular retraction, wall walking with shoulder abduction, and hands behind head external rotation.  She was encouraged to do these twice a day, holding 3 seconds and repeating 5 times when permitted by her physician.     PT Education - 06/23/17 1653    Education provided  Yes    Education Details  Lymphedema risk reduction and post op shoulder ROM HEP    Person(s) Educated  Patient;Parent(s) Mom and grandmother    Methods  Explanation;Demonstration;Handout    Comprehension  Verbalized understanding;Returned demonstration          PT Long Term Goals - 06/23/17 1811      PT LONG TERM GOAL #1   Title  Patient will demonstrate she has regained shoulder ROM and function post operatively.    Time  [facility] Goals - 06/23/17 1811      Patient will be able to verbalize understanding of pertinent lymphedema risk reduction practices relevant to her diagnosis specifically related to skin care.   Time  1    Period  Days    Status  Achieved      Patient will be able to return demonstrate and/or verbalize understanding of the post-op home exercise program related to regaining shoulder range of motion.   Time  1    Period  Days    Status  Achieved       Patient will be able to verbalize understanding of the importance of attending the postoperative After Breast Cancer Class for further lymphedema risk reduction education and therapeutic exercise.   Time  1    Period  Days    Status  Achieved            Plan - 06/23/17 1654    Clinical Impression Statement  Patient was diagnosed on 06/07/17 with left grade 1 invasive ductal carcinoma breast cancer. There are 3 areas in the left breast all in the upper outer quadrant measuring 1.9 cm, 1.5 cm, and 8 mm. They are ER/PR positive and HER2 negative with a Ki67 of 25%. There is a 9 cm area of calcifications. Her multidisciplinary medical team met prior to her assessments to determine a recommended treatment plan. She is planning to have a bilateral mastectomy and left sentinel node biopsy followed by Mammaprint testing and anti-estrogen therapy. She will benefit from a post op PT reassessment to determine needs.    History and Personal Factors relevant to plan of care:  Lives alone    Clinical Presentation  Stable    Clinical Decision Making  Low    Rehab Potential  Excellent    Clinical Impairments Affecting Rehab Potential  None    PT Frequency  -- Eval and 1 f/u visit    PT Treatment/Interventions  ADLs/Self Care Home Management;Therapeutic exercise;Patient/family education    PT Next Visit Plan  Will reassess 3-4 weeks post op    PT Home Exercise Plan  Post op shoulder ROM HEP    Consulted and Agree with Plan of Care  Patient;Family member/caregiver    Family Member Consulted  Mother and grandmother       Patient will benefit from skilled therapeutic intervention in order to improve the following deficits and impairments:  Decreased knowledge of precautions, Impaired UE functional use, Decreased range of motion, Postural dysfunction, Pain  Visit Diagnosis: Malignant neoplasm of upper-outer quadrant of left breast in female, estrogen receptor positive ([place]) - Plan: PT plan of care  cert/re-cert  Abnormal posture - Plan: PT plan of care cert/re-cert   Patient will follow up at outpatient cancer rehab 3-4 weeks following surgery.  If the patient requires physical therapy at that time, a specific plan will be dictated and sent to the referring physician for approval. The patient was educated today on appropriate basic range of motion exercises to begin post operatively and the importance of attending the After Breast Cancer class following surgery.  Patient was educated today on lymphedema risk reduction practices as it pertains to recommendations that will benefit the patient immediately following surgery.  She verbalized good understanding.     Problem List Patient Active Problem List   Diagnosis Date Noted  . Malignant neoplasm of upper-outer quadrant of left breast in female, estrogen receptor positive ([place]) 06/17/2017  . Menorrhagia with irregular cycle 06/03/2017  . Left breast mass 06/03/2017    Annia Friendly, PT 06/23/17 6:14 PM  [place], [place], [address] Phone: [phone]   Fax:  [phone]  Name: [place]  Schiavo MRN: [id number] Date of Birth: 1990-05-19

## 2017-06-23 NOTE — Progress Notes (Signed)
Radiation Oncology         [phone]) [id number] ________________________________  Name: Caroline Matthews        MRN: [id number]  Date of Service: 06/23/2017 DOB: 01/28/1991  SH:[id number], No Pcp Per  Rolm Bookbinder, MD     REFERRING PHYSICIAN: Rolm Bookbinder, MD   DIAGNOSIS: The encounter diagnosis was Malignant neoplasm of upper-outer quadrant of left breast in female, estrogen receptor positive ([place]).   HISTORY OF PRESENT ILLNESS: Caroline Matthews is a 27 y.o. female seen in the multidisciplinary breast clinic for a new diagnosis of left  breast cancer. The patient was noted to have palpable lumps and bloody nipple discharge in the left breast. She has a maternal family history of breast cancer and was seen for diagnostic imaging which showed a 1.9 x 1.5 x 1.2 cm mass at 2:30, a 15 x 1.3 x 1 cm mass at 2:45, a 9 cm grouping of calcifications posterior to the masses, and at 2:00, a 8 x 7 x 7 mm mass that has not been biopsied. Her axilla was negative for adenopathy. She underwent biopsy on 06/15/17 of both the 2:30 and 2:45 positions. Final pathology revealed a grade 1, invasive ductal carcinoma with DCIS, ER/PR positive, HER2 negative, and a Ki 67 of 25%. She comes today to discuss options of treatment for her cancer.   PREVIOUS RADIATION THERAPY: No   PAST MEDICAL HISTORY:  Past Medical History:  Diagnosis Date  . Anxiety        PAST SURGICAL HISTORY: Past Surgical History:  Procedure Laterality Date  . MANDIBLE SURGERY       FAMILY HISTORY:  Family History  Problem Relation Age of Onset  . Heart attack Father   . Breast cancer Maternal Aunt   . Breast cancer Maternal Grandmother      SOCIAL HISTORY:  reports that she quit smoking about 7 weeks ago. Her smoking use included cigarettes. She has never used smokeless tobacco. She reports that she drinks alcohol. She reports that she has current or past drug history. Drug: Marijuana. The patient is single. She works at [employer].      ALLERGIES: Patient has no known allergies.   MEDICATIONS:  No current outpatient medications on file.   No current facility-administered medications for this encounter.      REVIEW OF SYSTEMS: On review of systems, the patient reports that she is doing well overall. She denies any chest pain, shortness of breath, cough, fevers, chills, night sweats, unintended weight changes. She denies any bowel or bladder disturbances, and denies abdominal pain, nausea or vomiting. She denies any new musculoskeletal or joint aches or pains. A complete review of systems is obtained and is otherwise negative.     PHYSICAL EXAM:  Wt Readings from Last 3 Encounters:  06/23/17 208 lb 3.2 oz (94.4 kg)  06/02/17 213 lb (96.6 kg)  05/04/17 212 lb (96.2 kg)   Temp Readings from Last 3 Encounters:  06/23/17 98.4 F (36.9 C) (Oral)  05/04/17 99.5 F (37.5 C) (Oral)  10/07/16 98.3 F (36.8 C) (Oral)   BP Readings from Last 3 Encounters:  06/23/17 133/82  06/02/17 (!) 110/58  05/04/17 (!) 124/94   Pulse Readings from Last 3 Encounters:  06/23/17 78  06/02/17 80  05/04/17 84    In general this is a well appearing African American female in no acute distress. She is alert and oriented x4 and appropriate throughout the examination. HEENT reveals that the patient is normocephalic, atraumatic. EOMs are  intact. PERRLA. Skin is intact without any evidence of gross lesions.Cardiopulmonary assessment is negative for acute distress and she exhibits normal effort. Breast exam is deferred.    ECOG = 1  0 - Asymptomatic (Fully active, able to carry on all predisease activities without restriction)  1 - Symptomatic but completely ambulatory (Restricted in physically strenuous activity but ambulatory and able to carry out work of a light or sedentary nature. For example, light housework, office work)  2 - Symptomatic, <50% in bed during the day (Ambulatory and capable of all self care but unable to carry  out any work activities. Up and about more than 50% of waking hours)  3 - Symptomatic, >50% in bed, but not bedbound (Capable of only limited self-care, confined to bed or chair 50% or more of waking hours)  4 - Bedbound (Completely disabled. Cannot carry on any self-care. Totally confined to bed or chair)  5 - Death   Eustace Pen MM, Creech RH, Tormey DC, et al. [phone]). "Toxicity and response criteria of the [facility]". [place] Oncol. 5 (6): 649-55    LABORATORY DATA:  Lab Results  Component Value Date   WBC 6.9 06/02/2017   HGB 11.7 06/02/2017   HCT 36.6 06/02/2017   MCV 86 06/02/2017   PLT 209 06/02/2017   Lab Results  Component Value Date   NA 139 06/02/2017   K 4.1 06/02/2017   CL 104 06/02/2017   CO2 23 06/02/2017   Lab Results  Component Value Date   ALT 13 06/02/2017   AST 16 06/02/2017   ALKPHOS 42 06/02/2017   BILITOT <0.2 06/02/2017      RADIOGRAPHY: US Breast Ltd Uni Left Inc Axilla  Result Date: 06/07/2017 CLINICAL DATA:  Palpable lumps in the left breast. EXAM: DIGITAL DIAGNOSTIC LEFT MAMMOGRAM WITH CAD AND TOMO ULTRASOUND LEFT BREAST COMPARISON:  Previous exam(s). ACR Breast Density Category c: The breast tissue is heterogeneously dense, which may obscure small masses. FINDINGS: There are pleomorphic calcifications in the left upper and central breast in a segmental distribution spanning at least 9 cm. There appear to be obscured masses in the region of the palpable lumps and distortion in the region of the more anterior of the 2 palpable lumps. I suspect other obscured masses in the upper outer left breast. Mammographic images were processed with CAD. On physical exam, a firm palpable lump is identified, correlating with the more anterior BB on the mammogram at the site of distortion. Targeted ultrasound is performed, showing a focal mass in the region of the patient's palpable lump and suspected distortion on the mammogram seen at 2:30, 3  cm from the nipple. This mass is taller than wide and extends into the overlying skin. The mass measures 1.9 x 1.5 by 1.2 cm. Another irregular hypoechoic mass is seen at 3 o'clock, 5 cm from the nipple measuring 1.5 x 1.0 x 1.3 cm. A final mass is seen at 2 o'clock, 8 cm from the nipple measuring 8 x 7 x 7 mm. No axillary adenopathy. IMPRESSION: The calcifications and multiple breast masses are highly concerning for breast cancer. The palpable lump correlates with an irregular mass that appears to invade the overlying skin. No axillary adenopathy. RECOMMENDATION: Recommend ultrasound-guided biopsy of the palpable mass at 2:30, 3 cm from the nipple. Recommend stereotactic biopsy of the most posterior calcifications to determine extent of disease. I suspect disease is more extensive than appreciated with ultrasound. If breast conservation is being considered after  biopsies, recommend breast MRI. I have discussed the findings and recommendations with the patient. Results were also provided in writing at the conclusion of the visit. If applicable, a reminder letter will be sent to the patient regarding the next appointment. BI-RADS CATEGORY  5: Highly suggestive of malignancy. Electronically Signed   By: Dorise Bullion III M.D   On: 06/07/2017 10:21   Mm Diag Breast Tomo Bilateral  Result Date: 06/07/2017 CLINICAL DATA:  Palpable lumps in the left breast. EXAM: DIGITAL DIAGNOSTIC LEFT MAMMOGRAM WITH CAD AND TOMO ULTRASOUND LEFT BREAST COMPARISON:  Previous exam(s). ACR Breast Density Category c: The breast tissue is heterogeneously dense, which may obscure small masses. FINDINGS: There are pleomorphic calcifications in the left upper and central breast in a segmental distribution spanning at least 9 cm. There appear to be obscured masses in the region of the palpable lumps and distortion in the region of the more anterior of the 2 palpable lumps. I suspect other obscured masses in the upper outer left breast.  Mammographic images were processed with CAD. On physical exam, a firm palpable lump is identified, correlating with the more anterior BB on the mammogram at the site of distortion. Targeted ultrasound is performed, showing a focal mass in the region of the patient's palpable lump and suspected distortion on the mammogram seen at 2:30, 3 cm from the nipple. This mass is taller than wide and extends into the overlying skin. The mass measures 1.9 x 1.5 by 1.2 cm. Another irregular hypoechoic mass is seen at 3 o'clock, 5 cm from the nipple measuring 1.5 x 1.0 x 1.3 cm. A final mass is seen at 2 o'clock, 8 cm from the nipple measuring 8 x 7 x 7 mm. No axillary adenopathy. IMPRESSION: The calcifications and multiple breast masses are highly concerning for breast cancer. The palpable lump correlates with an irregular mass that appears to invade the overlying skin. No axillary adenopathy. RECOMMENDATION: Recommend ultrasound-guided biopsy of the palpable mass at 2:30, 3 cm from the nipple. Recommend stereotactic biopsy of the most posterior calcifications to determine extent of disease. I suspect disease is more extensive than appreciated with ultrasound. If breast conservation is being considered after biopsies, recommend breast MRI. I have discussed the findings and recommendations with the patient. Results were also provided in writing at the conclusion of the visit. If applicable, a reminder letter will be sent to the patient regarding the next appointment. BI-RADS CATEGORY  5: Highly suggestive of malignancy. Electronically Signed   By: Dorise Bullion III M.D   On: 06/07/2017 10:21   Mm Clip Placement Left  Result Date: 06/15/2017 CLINICAL DATA:  Status post ultrasound-guided biopsy of 2 left breast masses and stereotactic core needle biopsy of left breast calcifications. EXAM: DIAGNOSTIC LEFT MAMMOGRAM POST ULTRASOUND AND STEREOTACTIC GUIDED BIOPSY COMPARISON:  PRIOR EXAMS FINDINGS: Mammographic images were  obtained following ultrasound and stereotactic guided biopsy of 2 left breast masses and left breast calcifications. The ribbon shaped and the heart shaped biopsy clips lie in the expected locations of the lateral, slightly upper outer quadrant, left breast masses. The coil shaped biopsy clip lies adjacent to residual lateral, slightly upper outer quadrant, posterior left breast calcifications. IMPRESSION: Well-positioned biopsy clips following ultrasound-guided and stereotactic guided biopsy of left breast masses and calcifications. Final Assessment: Post Procedure Mammograms for Marker Placement Electronically Signed   By: Lajean Manes M.D.   On: 06/15/2017 09:08   Mm Lt Breast Bx W Loc Dev 1st Lesion Image Bx Spec  Stereo Guide  Addendum Date: 06/16/2017   ADDENDUM REPORT: 06/16/2017 10:57 ADDENDUM: Pathology revealed GRADE I INVASIVE DUCTAL CARCINOMA, DUCTAL CARCINOMA IN SITU of the Left breast, both locations, upper outer quadrant, 2:30 o'clock, 3 cmfn and 2:45 o'clock, 5 cmfn. INTERMEDIATE GRADE DUCTAL CARCINOMA IN SITU WITH CALCIFICATIONS of the Left breast, upper outer quadrant, posterior. This was found to be concordant by Dr. Lajean Manes. Pathology results were discussed with the patient by telephone. The patient reported doing well after the biopsies with tenderness at the sites. Post biopsy instructions and care were reviewed and questions were answered. The patient was encouraged to call [facility] for any additional concerns. The patient was referred to [facility] at [facility] on Jun 23, 2017. Recommendation for bilateral breast MRI due to age, density and to determine extent of disease. Pathology results reported by Terie Purser, RN on 06/16/2017. Electronically Signed   By: Lajean Manes M.D.   On: 06/16/2017 10:57   Result Date: 06/16/2017 CLINICAL DATA:  Patient presents for stereotactic core needle  biopsy of left breast calcifications. EXAM: LEFT BREAST STEREOTACTIC CORE NEEDLE BIOPSY COMPARISON:  Previous exams. FINDINGS: The patient and I discussed the procedure of stereotactic-guided biopsy including benefits and alternatives. We discussed the high likelihood of a successful procedure. We discussed the risks of the procedure including infection, bleeding, tissue injury, clip migration, and inadequate sampling. Informed written consent was given. The usual time out protocol was performed immediately prior to the procedure. Using sterile technique and 1% Lidocaine as local anesthetic, under stereotactic guidance, a 9 gauge vacuum assisted device was used to perform core needle biopsy of calcifications in the lateral, slightly upper outer quadrant, posterior depth, using a superior approach. Specimen radiograph was performed showing multiple calcifications for which biopsy was performed. Specimens with calcifications are identified for pathology. Lesion quadrant: Upper outer quadrant At the conclusion of the procedure, a coil shaped tissue marker clip was deployed into the biopsy cavity. Follow-up 2-view mammogram was performed and dictated separately. IMPRESSION: Stereotactic-guided biopsy of left breast calcifications. No apparent complications. Electronically Signed: By: Lajean Manes M.D. On: 06/15/2017 09:06   [place] Lt Breast Bx W Loc Dev 1st Lesion Img Bx Spec US Guide  Addendum Date: 06/16/2017   ADDENDUM REPORT: 06/16/2017 10:58 ADDENDUM: Pathology revealed GRADE I INVASIVE DUCTAL CARCINOMA, DUCTAL CARCINOMA IN SITU of the Left breast, both locations, upper outer quadrant, 2:30 o'clock, 3 cmfn and 2:45 o'clock, 5 cmfn. INTERMEDIATE GRADE DUCTAL CARCINOMA IN SITU WITH CALCIFICATIONS of the Left breast, upper outer quadrant, posterior. This was found to be concordant by Dr. Lajean Manes. Pathology results were discussed with the patient by telephone. The patient reported doing well after the biopsies  with tenderness at the sites. Post biopsy instructions and care were reviewed and questions were answered. The patient was encouraged to call [facility] for any additional concerns. The patient was referred to [facility] at [facility] on Jun 23, 2017. Recommendation for bilateral breast MRI due to age, density and to determine extent of disease. Pathology results reported by Terie Purser, RN on 06/16/2017. Electronically Signed   By: Lajean Manes M.D.   On: 06/16/2017 10:58   Result Date: 06/16/2017 CLINICAL DATA:  Patient presents for ultrasound-guided core needle biopsy of left breast masses. EXAM: ULTRASOUND GUIDED LEFT BREAST CORE NEEDLE BIOPSY: LESION 1 ULTRASOUND GUIDED LEFT BREAST CORE  NEEDLE BIOPSY: LESION 2 COMPARISON:  Previous exam(s). FINDINGS: I met with the patient and we discussed the procedure of ultrasound-guided biopsy, including benefits and alternatives. We discussed the high likelihood of a successful procedure. We discussed the risks of the procedure, including infection, bleeding, tissue injury, clip migration, and inadequate sampling. Informed written consent was given. The usual time-out protocol was performed immediately prior to the procedure. LESION 1: Lesion quadrant: Upper outer quadrant; 2:30 o'clock, 3 cm from the nipple. Using sterile technique and 1% Lidocaine as local anesthetic, under direct ultrasound visualization, a 12 gauge spring-loaded device was used to perform biopsy of the mass at the 2:30 o'clock position using an inferior, lateral approach. At the conclusion of the procedure a ribbon shaped tissue marker clip was deployed into the biopsy cavity. LESION 2: Lesion quadrant: Upper outer quadrant; 2:45 o'clock, 5 cm from the nipple. Using sterile technique and 1% Lidocaine as local anesthetic, under direct ultrasound visualization, a 12 gauge spring-loaded device was used to perform  biopsy of the mass at the 240 5 o'clock position using an inferior approach. At the conclusion of the procedure a heart shaped tissue marker clip was deployed into the biopsy cavity. Follow up 2 view mammogram was performed and dictated separately. IMPRESSION: Ultrasound guided biopsy of 2 masses in the upper outer quadrant of the left breast. No apparent complications. Electronically Signed: By: Lajean Manes M.D. On: 06/15/2017 08:35   [place] Lt Breast Bx W Loc Dev Ea Add Lesion Img Bx Spec US Guide  Addendum Date: 06/16/2017   ADDENDUM REPORT: 06/16/2017 10:58 ADDENDUM: Pathology revealed GRADE I INVASIVE DUCTAL CARCINOMA, DUCTAL CARCINOMA IN SITU of the Left breast, both locations, upper outer quadrant, 2:30 o'clock, 3 cmfn and 2:45 o'clock, 5 cmfn. INTERMEDIATE GRADE DUCTAL CARCINOMA IN SITU WITH CALCIFICATIONS of the Left breast, upper outer quadrant, posterior. This was found to be concordant by Dr. Lajean Manes. Pathology results were discussed with the patient by telephone. The patient reported doing well after the biopsies with tenderness at the sites. Post biopsy instructions and care were reviewed and questions were answered. The patient was encouraged to call [facility] for any additional concerns. The patient was referred to [facility] on Jun 23, 2017. Recommendation for bilateral breast MRI due to age, density and to determine extent of disease. Pathology results reported by Terie Purser, RN on 06/16/2017. Electronically Signed   By: Lajean Manes M.D.   On: 06/16/2017 10:58   Result Date: 06/16/2017 CLINICAL DATA:  Patient presents for ultrasound-guided core needle biopsy of left breast masses. EXAM: ULTRASOUND GUIDED LEFT BREAST CORE NEEDLE BIOPSY: LESION 1 ULTRASOUND GUIDED LEFT BREAST CORE NEEDLE BIOPSY: LESION 2 COMPARISON:  Previous exam(s). FINDINGS: I met with the patient and we discussed  the procedure of ultrasound-guided biopsy, including benefits and alternatives. We discussed the high likelihood of a successful procedure. We discussed the risks of the procedure, including infection, bleeding, tissue injury, clip migration, and inadequate sampling. Informed written consent was given. The usual time-out protocol was performed immediately prior to the procedure. LESION 1: Lesion quadrant: Upper outer quadrant; 2:30 o'clock, 3 cm from the nipple. Using sterile technique and 1% Lidocaine as local anesthetic, under direct ultrasound visualization, a 12 gauge spring-loaded device was used to perform biopsy of the mass at the 2:30 o'clock position using an inferior, lateral approach. At the conclusion of the procedure a ribbon shaped tissue marker clip  was deployed into the biopsy cavity. LESION 2: Lesion quadrant: Upper outer quadrant; 2:45 o'clock, 5 cm from the nipple. Using sterile technique and 1% Lidocaine as local anesthetic, under direct ultrasound visualization, a 12 gauge spring-loaded device was used to perform biopsy of the mass at the 240 5 o'clock position using an inferior approach. At the conclusion of the procedure a heart shaped tissue marker clip was deployed into the biopsy cavity. Follow up 2 view mammogram was performed and dictated separately. IMPRESSION: Ultrasound guided biopsy of 2 masses in the upper outer quadrant of the left breast. No apparent complications. Electronically Signed: By: Lajean Manes M.D. On: 06/15/2017 08:35       IMPRESSION/PLAN: 1. 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 Dr. Lisbeth Renshaw discusses the pathology findings and reviews the nature of invasive breast disease. The consensus from the breast conference includes left mastectomy with sentinel node biopsy and postop Mammaprint score to determine a role for systemic therapy. Provided there is no lymphatic involvement, or large tumor that's  invasive, we would not anticipate a role for radiotherapy. She would benefit from adjuvant antiestrogen therapy. We discussed the risks, benefits, short, and long term effects of radiotherapy in the case of this being recommended at a later time. Dr. Lisbeth Renshaw discusses the delivery and logistics of radiotherapy and anticipates a course of 6 1/2 weeks of radiotherapy if we proceeded in this manner. We will follow up with her postop course and review her case in conference, and we'd be happy to see her back if radiotherapy is indicated.   The above documentation reflects my direct findings during this shared patient visit. Please see the separate note by Dr. Lisbeth Renshaw on this date for the remainder of the patient's plan of care.    Carola Rhine, PAC

## 2017-06-23 NOTE — Assessment & Plan Note (Signed)
06/15/2017: Palpable left breast lump with bloody nipple discharge for the past 5 years extremely dense breasts 9 cm area of calcifications ultrasound 2:30 position 1.9 cm and 2:45 position 1.5 cm biopsy-proven grade 1 IDC ER 95%, PR 95%, Ki-67 25%, HER-2 negative ratio 1.1, T1c N0 stage I a clinical stage  Pathology and radiology counseling:Discussed with the patient, the details of pathology including the type of breast cancer,the clinical staging, the significance of ER, PR and HER-2/neu receptors and the implications for treatment. After reviewing the pathology in detail, we proceeded to discuss the different treatment options between surgery, radiation, chemotherapy, antiestrogen therapies.  Recommendations: 1.  Bilateral mastectomies followed by 2. Mammaprint testing to determine if chemotherapy would be of any benefit followed by 3. Adjuvant antiestrogen therapy  Mammaprint counseling: MINDACT is a prospective, randomized phase III controlled trial that investigates the clinical utility of MammaPrint, when compared to standard clinical pathological criteria, with 6,693 patients enrolled from over 111 institutions. Clinical high-risk patients with a Low Risk MammaPrint result, including 48% node-positive, had 5-year distant metastasis-free survival rate in excess of 94 percent, whether randomized to receive adjuvant chemotherapy or not proving MammaPrint's ability to safely identify Low Risk patients.  Return to clinic after surgery to discuss final pathology report and to send tissue for Mammaprint testing

## 2017-06-23 NOTE — Progress Notes (Signed)
[facility] CONSULT NOTE  Patient Care Team: Patient, No Pcp Per as PCP - General (General Practice) Rolm Bookbinder, MD as Consulting Physician (General Surgery) Nicholas Lose, MD as Consulting Physician (Hematology and Oncology) Kyung Rudd, MD as Consulting Physician (Radiation Oncology)  CHIEF COMPLAINTS/PURPOSE OF CONSULTATION:  Newly diagnosed breast cancer  HISTORY OF PRESENTING ILLNESS:  [place] 27 y.o. female is here because of recent diagnosis of left breast cancer.  Patient felt a lump in the left breast for the past 5 years.  She finally got around to getting a mammogram and ultrasound.  Mammogram showed extremely dense breast.  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  In addition to this there was a total of 9 cm of calcifications.  She was presented this morning to the multidisciplinary tumor board and she is here today at the [facility] to discuss her treatment plan.  She is accompanied by her mother.  I reviewed her records extensively and collaborated the history with the patient.  SUMMARY OF ONCOLOGIC HISTORY:   Malignant neoplasm of upper-outer quadrant of left breast in female, estrogen receptor positive ([place])   06/15/2017 Initial Diagnosis    Palpable left breast lump with bloody nipple discharge for the past 5 years extremely dense breasts 9 cm area of calcifications ultrasound 2:30 position 1.9 cm and 2:45 position 1.5 cm biopsy-proven grade 1 IDC ER 95%, PR 95%, Ki-67 25%, HER-2 negative ratio 1.1, T1c N0 stage I a clinical stage       MEDICAL HISTORY:  Past Medical History:  Diagnosis Date  . Anxiety      SURGICAL HISTORY: Past Surgical History:  Procedure Laterality Date  . MANDIBLE SURGERY      SOCIAL HISTORY: Social History   Socioeconomic History  . Marital status: Single    Spouse name: Not on file  . Number of children: Not on file  . Years of education: Not on file  . Highest education level: Not on file  Occupational History  . Not on file  Social Needs  . Financial resource strain: Not on file  . Food insecurity:    Worry: Not on file    Inability: Not on file  . Transportation needs:    Medical: Not on file    Non-medical: Not on file  Tobacco Use  . Smoking status: Former Smoker    Types: Cigarettes    Last attempt to quit: 05/04/2017    Years since quitting: 0.1  . Smokeless tobacco: Never Used  Substance and Sexual Activity  . Alcohol use: Yes    Comment: occ  . Drug use: Yes    Types: Marijuana  . Sexual activity: Yes    Comment: Female partner  Lifestyle  . Physical activity:    Days per week: Not on file    Minutes per session: Not on file  . Stress: Not on file  Relationships  . Social connections:    Talks on phone: Not on file    Gets together: Not on file    Attends religious service: Not on file    Active member of club or organization: Not on file  Attends meetings of clubs or organizations: Not on file    Relationship status: Not on file  . Intimate partner violence:    Fear of current or ex partner: Not on file    Emotionally abused: Not on file    Physically abused: Not on file    Forced sexual activity: Not on file  Other Topics Concern  . Not on file  Social History Narrative  . Not on file    FAMILY HISTORY: Family History  Problem Relation Age of Onset  . Heart attack Father   . Breast cancer Maternal Aunt   . Breast cancer Maternal Grandmother     ALLERGIES:  has No Known Allergies.  MEDICATIONS:  No current outpatient medications on file.   No current facility-administered medications for this visit.      REVIEW OF SYSTEMS:   Constitutional: Denies fevers, chills or abnormal night sweats Eyes: Denies blurriness of vision, double vision or watery eyes Ears, nose, mouth, throat, and face: Denies mucositis or sore throat Respiratory: Denies cough, dyspnea or wheezes Cardiovascular: Denies palpitation, chest discomfort or lower extremity swelling Gastrointestinal:  Denies nausea, heartburn or change in bowel habits Skin: Denies abnormal skin rashes Lymphatics: Denies new lymphadenopathy or easy bruising Neurological:Denies numbness, tingling or new weaknesses Behavioral/Psych: Mood is stable, no new changes  Breast: Palpable lump in the left breast All other systems were reviewed with the patient and are negative.  PHYSICAL EXAMINATION: ECOG PERFORMANCE STATUS: 1 - Symptomatic but completely ambulatory  Vitals:   06/23/17 1259  BP: 133/82  Pulse: 78  Resp: 14  Temp: 98.4 F (36.9 C)  SpO2: 100%   Filed Weights   06/23/17 1259  Weight: 208 lb 3.2 oz (94.4 kg)    GENERAL:alert, no distress and comfortable SKIN: skin color, texture, turgor are normal, no rashes or significant lesions EYES: normal, conjunctiva are pink and non-injected, sclera clear OROPHARYNX:no exudate, no erythema and lips, buccal mucosa, and tongue normal  NECK: supple, thyroid normal size, non-tender, without nodularity LYMPH:  no palpable lymphadenopathy in the cervical, axillary or inguinal LUNGS: clear to auscultation and percussion with normal breathing effort HEART: regular rate & rhythm and no murmurs and no lower extremity edema ABDOMEN:abdomen soft, non-tender and normal bowel sounds Musculoskeletal:no cyanosis of digits and no clubbing  PSYCH: alert & oriented x 3 with fluent speech NEURO: no focal motor/sensory deficits BREAST: Palpable lumps in the left breast. No palpable axillary or supraclavicular lymphadenopathy (exam performed in the presence of a chaperone)   LABORATORY DATA:  I have  reviewed the data as listed Lab Results  Component Value Date   WBC 6.9 06/02/2017   HGB 11.7 06/02/2017   HCT 36.6 06/02/2017   MCV 86 06/02/2017   PLT 209 06/02/2017   Lab Results  Component Value Date   NA 139 06/02/2017   K 4.1 06/02/2017   CL 104 06/02/2017   CO2 23 06/02/2017    RADIOGRAPHIC STUDIES: I have personally reviewed the radiological reports and agreed with the findings in the report.  ASSESSMENT AND PLAN:  Malignant neoplasm of upper-outer quadrant of left breast in female, estrogen receptor positive ([place]) 06/15/2017: Palpable left breast lump with bloody nipple discharge for the past 5 years extremely dense breasts 9 cm area of calcifications ultrasound 2:30 position 1.9 cm and 2:45 position 1.5 cm biopsy-proven grade 1 IDC ER 95%, PR 95%, Ki-67 25%, HER-2 negative ratio 1.1, T1c N0 stage I a clinical stage  Pathology and radiology counseling:Discussed with  the patient, the details of pathology including the type of breast cancer,the clinical staging, the significance of ER, PR and HER-2/neu receptors and the implications for treatment. After reviewing the pathology in detail, we proceeded to discuss the different treatment options between surgery, radiation, chemotherapy, antiestrogen therapies.  Recommendations: 1.  Bilateral mastectomies followed by 2. Mammaprint testing to determine if chemotherapy would be of any benefit followed by 3. Adjuvant antiestrogen therapy  Mammaprint counseling: MINDACT is a prospective, randomized phase III controlled trial that investigates the clinical utility of MammaPrint, when compared to standard clinical pathological criteria, with 6,693 patients enrolled from over 111 institutions. Clinical high-risk patients with a Low Risk MammaPrint result, including 48% node-positive, had 5-year distant metastasis-free survival rate in excess of 94 percent, whether randomized to receive adjuvant chemotherapy or not proving MammaPrint's  ability to safely identify Low Risk patients.  Return to clinic after surgery to discuss final pathology report and to send tissue for Mammaprint testing   All questions were answered. The patient knows to call the clinic with any problems, questions or concerns.    Harriette Ohara, MD 06/23/17

## 2017-06-23 NOTE — Patient Instructions (Signed)

## 2017-06-24 ENCOUNTER — Other Ambulatory Visit: Payer: Self-pay | Admitting: *Deleted

## 2017-06-24 ENCOUNTER — Inpatient Hospital Stay: Payer: PRIVATE HEALTH INSURANCE

## 2017-06-24 ENCOUNTER — Encounter: Payer: Self-pay | Admitting: General Practice

## 2017-06-24 ENCOUNTER — Inpatient Hospital Stay (HOSPITAL_BASED_OUTPATIENT_CLINIC_OR_DEPARTMENT_OTHER): Payer: PRIVATE HEALTH INSURANCE | Admitting: Genetics

## 2017-06-24 ENCOUNTER — Encounter: Payer: Self-pay | Admitting: Genetics

## 2017-06-24 DIAGNOSIS — Z170 Estrogen receptor positive status [ER+]: Principal | ICD-10-CM

## 2017-06-24 DIAGNOSIS — Z1379 Encounter for other screening for genetic and chromosomal anomalies: Secondary | ICD-10-CM

## 2017-06-24 DIAGNOSIS — Z803 Family history of malignant neoplasm of breast: Secondary | ICD-10-CM | POA: Diagnosis not present

## 2017-06-24 DIAGNOSIS — C50412 Malignant neoplasm of upper-outer quadrant of left female breast: Secondary | ICD-10-CM | POA: Diagnosis not present

## 2017-06-24 LAB — RESEARCH LABS

## 2017-06-24 NOTE — Progress Notes (Addendum)
REFERRING PROVIDER: Nicholas Lose, MD [place], [place] [id number]  PRIMARY PROVIDER:  Patient, No Pcp Per  PRIMARY REASON FOR VISIT:  1. Malignant neoplasm of upper-outer quadrant of left breast in female, estrogen receptor positive ([place])   2. Family history of breast cancer     HISTORY OF PRESENT ILLNESS:   [name], a 27 y.o. female, was seen for a [facility] cancer genetics consultation at the request of Dr. Lindi Adie due to a personal and family history of breast cancer.  [name] presents to clinic today to discuss the possibility of a hereditary predisposition to cancer, genetic testing, and to further clarify her future cancer risks, as well as potential cancer risks for family members.   On 06/15/2017, at the age of 43, [name] was diagnosed with Invasive ductal carcinoma of the left breast ER+, PR+, HER2 -. She is currently planning ot have a bilateral mastectomy followed by adjuvant antiestrogen therapy.   CANCER HISTORY:    Malignant neoplasm of upper-outer quadrant of left breast in female, estrogen receptor positive ([place])   06/15/2017 Initial Diagnosis    Palpable left breast lump with bloody nipple discharge for the past 5 years extremely dense breasts 9 cm area of calcifications ultrasound 2:30 position 1.9 cm and 2:45 position 1.5 cm biopsy-proven grade 1 IDC ER 95%, PR 95%, Ki-67 25%, HER-2 negative ratio 1.1, T1c N0 stage I a clinical stage       HORMONAL RISK FACTORS:  Menarche was at age 17.  First live birth at age N/A.  Ovaries intact: yes.  Hysterectomy: no.  Menopausal status: premenopausal.  HRT use: 0 years. Colonoscopy: no; not examined. Mammogram within the last year: yes.   Past Medical History:  Diagnosis Date  . Anxiety   . Family history of breast cancer     Past Surgical History:  Procedure Laterality Date  . MANDIBLE SURGERY      Social History   Socioeconomic History  . Marital status: Single    Spouse  name: Not on file  . Number of children: Not on file  . Years of education: Not on file  . Highest education level: Not on file  Occupational History  . Not on file  Social Needs  . Financial resource strain: Not on file  . Food insecurity:    Worry: Not on file    Inability: Not on file  . Transportation needs:    Medical: Not on file    Non-medical: Not on file  Tobacco Use  . Smoking status: Former Smoker    Types: Cigarettes    Last attempt to quit: 05/04/2017    Years since quitting: 0.1  . Smokeless tobacco: Never Used  Substance and Sexual Activity  . Alcohol use: Yes    Comment: occ  . Drug use: Yes    Types: Marijuana  . Sexual activity: Yes    Comment: Female partner  Lifestyle  . Physical activity:    Days per week: Not on file    Minutes per session: Not on file  . Stress: Not on file  Relationships  . Social connections:    Talks on phone: Not on file    Gets together: Not on file    Attends religious service: Not on file    Active member of club or organization: Not on file    Attends meetings of clubs or organizations: Not on file    Relationship status: Not on file  Other Topics  Concern  . Not on file  Social History Narrative  . Not on file     FAMILY HISTORY:  We obtained a detailed, 4-generation family history.  Significant diagnoses are listed below: Family History  Problem Relation Age of Onset  . Heart attack Father   . Breast cancer Maternal Aunt   . Breast cancer Maternal Grandmother        died at 24, dx 70's   [name] has no children.  [name] has 4 paternal half-brothers in their 58's and 1 maternal half-brother in his 51's.   [name] father: is 76 with no history of cancer.  Paternal aunts/Uncles: 1 paternal aunt and 1 paternal ucnle in their 80's/50's with no history of cancer.  Paternal cousins: no history of cancer.  Paternal grandfather: alive in his 25's with no history of cancer.  Paternal grandmother:alive in  her 87's, no history of cancer.   [name] mother: is 107, no history of cancer. Has her uterus and ovaries intact.  Maternal Aunts/Uncles: 1 maternal aunt and 1 maternal uncle with no history of cancer.  Maternal cousins: no history of cancer.  Maternal grandfather: in his 47's with no history of cancer.  Maternal grandmother: died of breast cancer at 38.  This grandmother had maternal aunt who died of breast cancer.   [name] is unaware of previous family history of genetic testing for hereditary cancer risks. Caroline Matthews's maternal ancestors are of African American descent, and paternal ancestors are of [place] descent. There is no reported Ashkenazi Jewish ancestry. The Caroline Matthews reports her parents may be very distant cousins (22 8th cousins).   GENETIC COUNSELING ASSESSMENT: Caroline Matthews is a 27 y.o. female with a personal and family history which is somewhat suggestive of a Hereditary Cancer Predisposition Syndrome. We, therefore, discussed and recommended the following at today's visit.   DISCUSSION: We reviewed the characteristics, features and inheritance patterns of hereditary cancer syndromes. We also discussed genetic testing, including the appropriate family members to test, the process of testing, insurance coverage and turn-around-time for results. We discussed the implications of a negative, positive and/or variant of uncertain significant result. We recommended Ms. Destin pursue genetic testing for the Common Hereditary Cancers gene panel.   The Common Hereditary Cancer Panel offered by [facility] includes sequencing and/or deletion duplication testing of the following 47 genes: APC, ATM, AXIN2, BARD1, BMPR1A, BRCA1, BRCA2, BRIP1, CDH1, CDKN2A (p14ARF), CDKN2A (p16INK4a), CKD4, CHEK2, CTNNA1, DICER1, EPCAM (Deletion/duplication testing only), GREM1 (promoter region deletion/duplication testing only), KIT, MEN1, MLH1, MSH2, MSH3, MSH6, MUTYH, NBN, NF1, NHTL1, PALB2, PDGFRA, PMS2,  POLD1, POLE, PTEN, RAD50, RAD51C, RAD51D, SDHB, SDHC, SDHD, SMAD4, SMARCA4. STK11, TP53, TSC1, TSC2, and VHL.  The following genes were evaluated for sequence changes only: SDHA and HOXB13 c.251G>A variant only.   Ms. Penning indicated that the results of her genetic testing would not impact her surgical decisions.   We discussed that only 5-10% of cancers are associated with a Hereditary cancer predisposition syndrome.  One of the most common hereditary cancer syndromes that increases breast cancer risk is called Hereditary Breast and Ovarian Cancer (HBOC) syndrome.  This syndrome is caused by mutations in the BRCA1 and BRCA2 genes.  This syndrome increases an individual's lifetime risk to develop breast, ovarian, pancreatic, and other types of cancer.  There are also many other cancer predisposition syndromes caused by mutations in several other genes.  We discussed that if she is found to have a mutation in one of these genes,  it may impact surgical decisions, and alter future medical management recommendations such as increased cancer screenings and consideration of risk reducing surgeries.  A positive result could also have implications for the Caroline Matthews's family members.  A Negative result would mean we were unable to identify a hereditary component to her cancer, but does not rule out the possibility of a hereditary basis for her cancer.  There could be mutations that are undetectable by current technology, or in genes not yet tested or identified to increase cancer risk.    We discussed the potential to find a Variant of Uncertain Significance or VUS.  These are variants that have not yet been identified as pathogenic or benign, and it is unknown if this variant is associated with increased cancer risk or if this is a normal finding.  Most VUS's are reclassified to benign or likely benign.   It should not be used to make medical management decisions. With time, we suspect the lab will determine the  significance of any VUS's identified if any.   Based on Ms. Revard's personal and family history of cancer, she meets medical criteria for genetic testing. Despite that she meets criteria, she may still have an out of pocket cost. We discussed that if her out of pocket cost for testing is over $100, the laboratory will call and confirm whether she wants to proceed with testing.  If the out of pocket cost of testing is less than $100 she will be billed by the genetic testing laboratory.   PLAN: After considering the risks, benefits, and limitations, Ms. Dickard  provided informed consent to pursue genetic testing and the blood sample was [facility] to [facility] for analysis of the Common Hereditary Cancers Panel. Results should be available within approximately 2-3 weeks' time, at which point they will be disclosed by telephone to Ms. Russum, as will any additional recommendations warranted by these results. Ms. Eshleman will receive a summary of her genetic counseling visit and a copy of her results once available. This information will also be available in Epic. We encouraged Ms. Gieger to remain in contact with cancer genetics annually so that we can continuously update the family history and inform her of any changes in cancer genetics and testing that may be of benefit for her family. Ms. Sonier questions were answered to her satisfaction today. Our contact information was provided should additional questions or concerns arise.  Based on Ms. Goggins's family history, we recommended her maternal relatives, also have genetic counseling and testing. Ms. Estabrook will let us know if we can be of any assistance in coordinating genetic counseling and/or testing for this family member.   Lastly, we encouraged Ms. Mchugh to remain in contact with cancer genetics annually so that we can continuously update the family history and inform her of any changes in cancer genetics and testing that may be of benefit for  this family.   Ms.  Closson questions were answered to her satisfaction today. Our contact information was provided should additional questions or concerns arise. Thank you for the referral and allowing [place] to share in the care of your Caroline Matthews.   Tana Felts, MS, [facility] Certified Genetic Counselor [email or web address] phone: [phone]  The Caroline Matthews was seen for a total of 30 minutes in face-to-face genetic counseling. This Caroline Matthews was discussed with Drs. Magrinat, Lindi Adie and/or Burr Medico who agrees with the above.

## 2017-06-24 NOTE — Progress Notes (Signed)
[facility] Psychosocial Distress Screening Spiritual Care  Met with Caroline Matthews, her mom, and her grandmother Caroline Matthews in [facility] to introduce Reading team/resources, reviewing distress screen per protocol.  The patient scored a 5 on the Psychosocial Distress Thermometer which indicates moderate distress. Also assessed for distress and other psychosocial needs.   ONCBCN DISTRESS SCREENING 06/24/2017  Screening Type Initial Screening  Distress experienced in past week (1-10) 5  Emotional problem type Nervousness/Anxiety;Boredom  Referral to support programs Yes   Caroline Matthews welcomed connection as long as we kept the tone upbeat. Normalized feelings and provided emotional support. Introduced [employer] for pt and caregivers, encouraging Breast Cancer Support Group, Young Women's Support Group, and Chartered certified accountant as resources.  Follow up needed: Yes.  Plan to send handwritten f/u note of encouragement to foster rapport.   [place], [place], [facility] Pager [phone] Voicemail [phone]

## 2017-06-26 ENCOUNTER — Ambulatory Visit (HOSPITAL_COMMUNITY)
Admission: RE | Admit: 2017-06-26 | Discharge: 2017-06-26 | Disposition: A | Discharge status: Home / Self Care | Payer: PRIVATE HEALTH INSURANCE | Source: Ambulatory Visit | Attending: Hematology and Oncology | Admitting: Hematology and Oncology

## 2017-06-26 DIAGNOSIS — Z170 Estrogen receptor positive status [ER+]: Principal | ICD-10-CM

## 2017-06-26 DIAGNOSIS — C50412 Malignant neoplasm of upper-outer quadrant of left female breast: Secondary | ICD-10-CM

## 2017-06-26 MED ORDER — GADOBENATE DIMEGLUMINE 529 MG/ML IV SOLN: 20.0000 mL | Freq: Once | INTRAVENOUS | Status: DC | PRN

## 2017-06-29 ENCOUNTER — Encounter (HOSPITAL_COMMUNITY): Payer: Self-pay

## 2017-06-29 ENCOUNTER — Telehealth: Payer: Self-pay | Admitting: *Deleted

## 2017-06-29 ENCOUNTER — Ambulatory Visit (HOSPITAL_COMMUNITY): Payer: PRIVATE HEALTH INSURANCE

## 2017-06-29 NOTE — Telephone Encounter (Signed)
Spoke to pt concerning [place] from 06/23/17. Denies questions or concerns regarding dx or tx care plan. Pt wishes to cancel breast MRI. Informed Dr. Donne Hazel.  Called pt and informed breast MRI will be cancelled. Pt very relieved to hear this. Informed pt we will wait on genetic testing results to return then schedule sx. Received verbal understanding. Denies further needs at this time.

## 2017-07-01 ENCOUNTER — Encounter: Payer: Self-pay | Admitting: General Practice

## 2017-07-01 ENCOUNTER — Other Ambulatory Visit: Payer: Self-pay | Admitting: General Surgery

## 2017-07-01 ENCOUNTER — Telehealth: Payer: Self-pay | Admitting: *Deleted

## 2017-07-01 DIAGNOSIS — C50112 Malignant neoplasm of central portion of left female breast: Secondary | ICD-10-CM

## 2017-07-01 NOTE — Progress Notes (Signed)
[facility] Spiritual Care Note  Mailed handwritten note of encouragement and reminder of ongoing availability for support as pt desires.   [place], [place], [facility] Pager [phone] Voicemail [phone]

## 2017-07-01 NOTE — Telephone Encounter (Signed)
Voicemail received and forwarded to collaborative to assist with request from " [place], [employer] the case management company for [employer] [phone]).  F/U from 06-23-2017 provider note requesting plan for surgery for this patient.  Has referral been made?  What is the surgeon's name?"

## 2017-07-02 ENCOUNTER — Telehealth: Payer: Self-pay | Admitting: Obstetrics and Gynecology

## 2017-07-02 NOTE — Telephone Encounter (Signed)
Attempt to contact patient in follow up to her new diagnosis of breast cancer.  Her mailbox is still full and I am unable to leave a message for her.

## 2017-07-06 ENCOUNTER — Other Ambulatory Visit: Payer: Self-pay | Admitting: *Deleted

## 2017-07-06 MED ORDER — TAMOXIFEN CITRATE 20 MG PO TABS: 20.0000 mg | ORAL_TABLET | Freq: Every day | ORAL | 3 refills | Status: DC

## 2017-07-06 NOTE — Telephone Encounter (Signed)
Called pt and informed surgery will be slightly delayed. Per Dr Lindi Adie pt will start Tamoxifen. Gave instructions and directions. Denies further questions at this time.

## 2017-07-08 ENCOUNTER — Other Ambulatory Visit: Payer: PRIVATE HEALTH INSURANCE

## 2017-07-08 ENCOUNTER — Encounter: Payer: Self-pay | Admitting: Obstetrics and Gynecology

## 2017-07-08 ENCOUNTER — Other Ambulatory Visit: Payer: PRIVATE HEALTH INSURANCE | Admitting: Obstetrics and Gynecology

## 2017-07-08 ENCOUNTER — Telehealth: Payer: Self-pay | Admitting: Obstetrics and Gynecology

## 2017-07-08 NOTE — Telephone Encounter (Deleted)
°  The note that was originally documented on this patient has been moved to the patient to which it belongs. 

## 2017-07-08 NOTE — Telephone Encounter (Signed)
Triage, please contact patient to reschedule her appointment.  She is having abnormal uterine bleeding, and it will be important for her to follow up.  She was just recently diagnosed with breast cancer, and she has a lot of appointments to manage at this time.   [place].

## 2017-07-08 NOTE — Telephone Encounter (Signed)
Called patient regarding missed appointment. She forgot and will wait for a phone call to reschedule.

## 2017-07-08 NOTE — Telephone Encounter (Signed)
No answer, mailbox full, unable to leave message.

## 2017-07-09 ENCOUNTER — Other Ambulatory Visit: Payer: PRIVATE HEALTH INSURANCE

## 2017-07-12 ENCOUNTER — Telehealth: Payer: Self-pay | Admitting: *Deleted

## 2017-07-12 NOTE — Telephone Encounter (Signed)
I am forwarding this to our nursing supervisor who may be able to assist with scheduling the pelvic ultrasound.  Scheduling may be done at our office or at the hospital if it is more convenient for the patient.  She needs to have evaluation of her abnormal uterine bleeding.   [place]

## 2017-07-12 NOTE — Telephone Encounter (Signed)
See previous phone note.  Call placed to patient to review benefit options, see account note. There was no answer and I was unable to leave a message due to the voicemail box being full   cc: Dr Quincy Simmonds

## 2017-07-12 NOTE — Telephone Encounter (Signed)
07/08/17 telephone encounter closed in error.   Spoke with patient in regard to cancelled PUS for AUB. Patient declined to schedule at this time d/t cost. Advised RN will forward concerns to business office for return call. Patient is agreeable.   Routing to [employer]  Cc: Dr. Quincy Simmonds

## 2017-07-13 ENCOUNTER — Inpatient Hospital Stay (HOSPITAL_COMMUNITY): Admission: RE | Admit: 2017-07-13 | Payer: PRIVATE HEALTH INSURANCE | Source: Ambulatory Visit

## 2017-07-13 ENCOUNTER — Telehealth: Payer: Self-pay | Admitting: Genetics

## 2017-07-13 ENCOUNTER — Encounter: Payer: Self-pay | Admitting: *Deleted

## 2017-07-13 ENCOUNTER — Encounter: Payer: Self-pay | Admitting: Obstetrics and Gynecology

## 2017-07-13 ENCOUNTER — Telehealth: Payer: Self-pay | Admitting: Hematology and Oncology

## 2017-07-13 NOTE — Telephone Encounter (Signed)
Due to difficulty reaching patient, letter to your office for review.

## 2017-07-13 NOTE — Telephone Encounter (Signed)
Call to patient to discuss alternatives for pelvic ultrasound. Unable to leave message (voice mailbox is full.)

## 2017-07-13 NOTE — Telephone Encounter (Signed)
Call to patient. Voice mailbox remains full and unable to leave message.

## 2017-07-13 NOTE — Telephone Encounter (Signed)
Mailed patient calendar of upcoming June appointments per 5/17 sch message

## 2017-07-14 NOTE — Telephone Encounter (Signed)
Letter mailed certified and regular [place] mail encouraging patient contact to schedule pelvic ultrasound.

## 2017-07-14 NOTE — Telephone Encounter (Signed)
I have signed this letter and returned it to you.

## 2017-07-16 ENCOUNTER — Other Ambulatory Visit (HOSPITAL_COMMUNITY): Payer: PRIVATE HEALTH INSURANCE

## 2017-07-20 NOTE — Telephone Encounter (Signed)
Call to patient. States she has received letter and is agreeable to options.  Prefers to proceed in office instead of outside facility.   Caroline Matthews in business office will recheck benefits and call patient back with updated information and assist in scheduling.

## 2017-07-20 NOTE — Pre-Procedure Instructions (Signed)
[place]  07/20/2017      [facility], [place] [address] [place] [address] Phone: [phone] Fax: [phone]    Your procedure is scheduled on Mon., July 26, 2017 from 10:00AM-1:00PM  Report to [facility] Admitting Entrance "A" at 8:00AM  Call this number if you have problems the morning of surgery:  [phone]   Remember:  No food after midnight.  You may drink Clear Liquids until 3 hours (7:00AM) prior to surgery .  Clear liquids allowed are: Water, Juice (non-citric and without pulp), Carbonated beverages, Clear Tea, Black Coffee only, Plain Jell-O only, Gatorade and Plain Popsicles only   Please complete your PRE-SURGERY ENSURE 2 Bottles the night before surgery, and 1 Bottle by (7:00AM) the morning of surgery.  Please, if able, drink it in one setting. DO NOT SIP.   Take these medicines the morning of surgery with A SIP OF WATER: Tamoxifen (NOLVADEX)  As of today, stop taking all Other Aspirin Products, Vitamins, Fish oils, and Herbal medications. Also stop all NSAIDS i.e. Advil, Ibuprofen, Motrin, Aleve, Anaprox, Naproxen, BC and Goody Powders.    Do not wear jewelry, make-up or nail polish (fingers).  Do not wear lotions, powders, or perfumes, or deodorant.  Do not shave 48 hours prior to surgery.   Do not bring valuables to the hospital.  [facility] is not responsible for any belongings or valuables.  Contacts, dentures or bridgework may not be worn into surgery.  Leave your suitcase in the car.  After surgery it may be brought to your room.  For patients admitted to the hospital, discharge time will be determined by your treatment team.  Patients discharged the day of surgery will not be allowed to drive home.   Special instructions:   - Preparing For Surgery  Before surgery, you can play an important role. Because skin is not sterile, your  skin needs to be as free of germs as possible. You can reduce the number of germs on your skin by washing with CHG (chlorahexidine gluconate) Soap before surgery.  CHG is an antiseptic cleaner which kills germs and bonds with the skin to continue killing germs even after washing.    Oral Hygiene is also important to reduce your risk of infection.  Remember - BRUSH YOUR TEETH THE MORNING OF SURGERY WITH YOUR REGULAR TOOTHPASTE  Please do not use if you have an allergy to CHG or antibacterial soaps. If your skin becomes reddened/irritated stop using the CHG.  Do not shave (including legs and underarms) for at least 48 hours prior to first CHG shower. It is OK to shave your face.  Please follow these instructions carefully.   1. Shower the NIGHT BEFORE SURGERY and the MORNING OF SURGERY with CHG.   2. If you chose to wash your hair, wash your hair first as usual with your normal shampoo.  3. After you shampoo, rinse your hair and body thoroughly to remove the shampoo.  4. Use CHG as you would any other liquid soap. You can apply CHG directly to the skin and wash gently with a scrungie or a clean washcloth.   5. Apply the CHG Soap to your body ONLY FROM THE NECK DOWN.  Do not use on open wounds or open sores. Avoid contact with your eyes, ears, mouth and genitals (private parts). Wash Face and genitals (  private parts)  with your normal soap.  6. Wash thoroughly, paying special attention to the area where your surgery will be performed.  7. Thoroughly rinse your body with warm water from the neck down.  8. DO NOT shower/wash with your normal soap after using and rinsing off the CHG Soap.  9. Pat yourself dry with a CLEAN TOWEL.  10. Wear CLEAN PAJAMAS to bed the night before surgery, wear comfortable clothes the morning of surgery  11. Place CLEAN SHEETS on your bed the night of your first shower and DO NOT SLEEP WITH PETS.  Day of Surgery:  Do not apply any deodorants/lotions.  Please  wear clean clothes to the hospital/surgery center.   Remember to brush your teeth WITH YOUR REGULAR TOOTHPASTE.  Please read over the following fact sheets that you were given. Pain Booklet, Coughing and Deep Breathing and Surgical Site Infection Prevention

## 2017-07-21 ENCOUNTER — Encounter (HOSPITAL_COMMUNITY)
Admission: RE | Admit: 2017-07-21 | Discharge: 2017-07-21 | Disposition: A | Discharge status: Home / Self Care | Payer: PRIVATE HEALTH INSURANCE | Source: Ambulatory Visit | Attending: General Surgery | Admitting: General Surgery

## 2017-07-21 ENCOUNTER — Encounter (HOSPITAL_COMMUNITY): Payer: Self-pay

## 2017-07-21 ENCOUNTER — Other Ambulatory Visit: Payer: Self-pay

## 2017-07-21 DIAGNOSIS — Z01818 Encounter for other preprocedural examination: Secondary | ICD-10-CM | POA: Diagnosis not present

## 2017-07-21 LAB — CBC
HEMATOCRIT: 40.4 % (ref 36.0–46.0)
Hemoglobin: 12.5 g/dL (ref 12.0–15.0)
MCH: 26.5 pg (ref 26.0–34.0)
MCHC: 30.9 g/dL (ref 30.0–36.0)
MCV: 85.6 fL (ref 78.0–100.0)
Platelets: 231 10*3/uL (ref 150–400)
RBC: 4.72 MIL/uL (ref 3.87–5.11)
RDW: 14.4 % (ref 11.5–15.5)
WBC: 7 10*3/uL (ref 4.0–10.5)

## 2017-07-21 LAB — BASIC METABOLIC PANEL
Anion gap: 5 (ref 5–15)
BUN: 9 mg/dL (ref 6–20)
CHLORIDE: 110 mmol/L (ref 101–111)
CO2: 23 mmol/L (ref 22–32)
Calcium: 8.8 mg/dL — ABNORMAL LOW (ref 8.9–10.3)
Creatinine, Ser: 0.75 mg/dL (ref 0.44–1.00)
GFR calc non Af Amer: >60 mL/min (ref >60)
Glucose, Bld: 97 mg/dL (ref 65–99)
POTASSIUM: 4.2 mmol/L (ref 3.5–5.1)
SODIUM: 138 mmol/L (ref 135–145)

## 2017-07-21 NOTE — Telephone Encounter (Signed)
Patient is scheduled for bilateral mastectomy on 07-26-17.

## 2017-07-21 NOTE — Progress Notes (Signed)
PCP - Denies  Cardiologist - Denies  Chest x-ray - 05/04/17 (E)  EKG - Denies  Stress Test - Denies  ECHO - Denies  Cardiac Cath - Denies  Sleep Study - Denies CPAP - None  LABS- 07/21/17: CBC, BMP POC Upreg- 07/26/17  ASA- Denies  Anesthesia- No  Pt denies having chest pain, sob, or fever at this time. All instructions explained to the pt, with a verbal understanding of the material. Pt agrees to go over the instructions while at home for a better understanding. The opportunity to ask questions was provided.

## 2017-07-21 NOTE — Telephone Encounter (Signed)
Benefits have been re-verified for the recommended ultrasound. Forwarded to Gay Filler to review scheduling options prior to contacting patient  Routing to Lamont Snowball, RN

## 2017-07-21 NOTE — Telephone Encounter (Signed)
Received scheduling recommendations from [place]. Call placed to patient, reviewed benefits for recommended ultrasound, see account notes for details.  Patient understood and agreeable. Patient ready to schedule. Patient scheduled 08/12/17 with Dr Quincy Simmonds.  Patient is aware of the appointment date, arrival time and cancellation policy. Patient had no further questions.   Routing to provider for final review. Patient agreeable to disposition. Will close encounter.  Routing to Dr Quincy Simmonds  cc: Lamont Snowball, RN

## 2017-07-22 ENCOUNTER — Other Ambulatory Visit: Payer: PRIVATE HEALTH INSURANCE

## 2017-07-26 ENCOUNTER — Observation Stay (HOSPITAL_COMMUNITY)
Admission: RE | Admit: 2017-07-26 | Discharge: 2017-07-27 | Disposition: A | Discharge status: Home / Self Care | Payer: PRIVATE HEALTH INSURANCE | Source: Ambulatory Visit | Attending: General Surgery | Admitting: General Surgery

## 2017-07-26 ENCOUNTER — Encounter (HOSPITAL_COMMUNITY)
Admission: RE | Disposition: A | Discharge status: Home / Self Care | Payer: Self-pay | Source: Ambulatory Visit | Attending: General Surgery

## 2017-07-26 ENCOUNTER — Ambulatory Visit (HOSPITAL_COMMUNITY)
Admission: RE | Admit: 2017-07-26 | Discharge: 2017-07-26 | Disposition: A | Discharge status: Home / Self Care | Payer: PRIVATE HEALTH INSURANCE | Source: Ambulatory Visit | Attending: General Surgery | Admitting: General Surgery

## 2017-07-26 ENCOUNTER — Ambulatory Visit: Payer: PRIVATE HEALTH INSURANCE | Admitting: Hematology and Oncology

## 2017-07-26 ENCOUNTER — Encounter (HOSPITAL_COMMUNITY): Payer: Self-pay | Admitting: Anesthesiology

## 2017-07-26 ENCOUNTER — Other Ambulatory Visit: Payer: Self-pay

## 2017-07-26 ENCOUNTER — Ambulatory Visit (HOSPITAL_COMMUNITY): Payer: PRIVATE HEALTH INSURANCE | Admitting: Certified Registered Nurse Anesthetist

## 2017-07-26 DIAGNOSIS — N6021 Fibroadenosis of right breast: Secondary | ICD-10-CM | POA: Diagnosis not present

## 2017-07-26 DIAGNOSIS — Z6831 Body mass index (BMI) 31.0-31.9, adult: Secondary | ICD-10-CM | POA: Diagnosis not present

## 2017-07-26 DIAGNOSIS — Z170 Estrogen receptor positive status [ER+]: Secondary | ICD-10-CM | POA: Diagnosis not present

## 2017-07-26 DIAGNOSIS — E079 Disorder of thyroid, unspecified: Secondary | ICD-10-CM | POA: Insufficient documentation

## 2017-07-26 DIAGNOSIS — D0512 Intraductal carcinoma in situ of left breast: Principal | ICD-10-CM | POA: Insufficient documentation

## 2017-07-26 DIAGNOSIS — Z87891 Personal history of nicotine dependence: Secondary | ICD-10-CM | POA: Diagnosis not present

## 2017-07-26 DIAGNOSIS — D241 Benign neoplasm of right breast: Secondary | ICD-10-CM | POA: Insufficient documentation

## 2017-07-26 DIAGNOSIS — Z803 Family history of malignant neoplasm of breast: Secondary | ICD-10-CM | POA: Insufficient documentation

## 2017-07-26 DIAGNOSIS — E669 Obesity, unspecified: Secondary | ICD-10-CM | POA: Insufficient documentation

## 2017-07-26 DIAGNOSIS — N6012 Diffuse cystic mastopathy of left breast: Secondary | ICD-10-CM | POA: Insufficient documentation

## 2017-07-26 DIAGNOSIS — C50112 Malignant neoplasm of central portion of left female breast: Secondary | ICD-10-CM

## 2017-07-26 DIAGNOSIS — C50912 Malignant neoplasm of unspecified site of left female breast: Secondary | ICD-10-CM | POA: Diagnosis present

## 2017-07-26 HISTORY — PX: MASTECTOMY W/ SENTINEL NODE BIOPSY: SHX2001

## 2017-07-26 HISTORY — DX: Malignant neoplasm of unspecified site of left female breast: C50.912

## 2017-07-26 HISTORY — PX: MASTECTOMY: SHX3

## 2017-07-26 HISTORY — PX: MASTECTOMY COMPLETE / SIMPLE W/ SENTINEL NODE BIOPSY: SUR846

## 2017-07-26 SURGERY — MASTECTOMY WITH SENTINEL LYMPH NODE BIOPSY
Anesthesia: General | Site: Breast | Laterality: Bilateral

## 2017-07-26 MED ORDER — KETOROLAC TROMETHAMINE 15 MG/ML IJ SOLN: 15.0000 mg | Freq: Three times a day (TID) | INTRAMUSCULAR | Status: DC | PRN

## 2017-07-26 MED ORDER — MEPERIDINE HCL 50 MG/ML IJ SOLN: 6.2500 mg | INTRAMUSCULAR | Status: DC | PRN

## 2017-07-26 MED ORDER — BUPIVACAINE-EPINEPHRINE (PF) 0.25% -1:200000 IJ SOLN
INTRAMUSCULAR | Status: DC | PRN
  Administered 2017-07-26 (×2): 30 mL via PERINEURAL

## 2017-07-26 MED ORDER — OXYCODONE HCL 5 MG PO TABS
5.0000 mg | ORAL_TABLET | ORAL | Status: DC | PRN
  Administered 2017-07-26 – 2017-07-27 (×4): 5 mg via ORAL
  Filled 2017-07-26 (×4): qty 1

## 2017-07-26 MED ORDER — FENTANYL CITRATE (PF) 250 MCG/5ML IJ SOLN
INTRAMUSCULAR | Status: AC
  Filled 2017-07-26: qty 5

## 2017-07-26 MED ORDER — DEXAMETHASONE SODIUM PHOSPHATE 10 MG/ML IJ SOLN
INTRAMUSCULAR | Status: AC
  Filled 2017-07-26: qty 2

## 2017-07-26 MED ORDER — PROPOFOL 10 MG/ML IV BOLUS
INTRAVENOUS | Status: AC
  Filled 2017-07-26: qty 20

## 2017-07-26 MED ORDER — METHOCARBAMOL 500 MG PO TABS
500.0000 mg | ORAL_TABLET | Freq: Three times a day (TID) | ORAL | Status: DC
  Administered 2017-07-26 – 2017-07-27 (×3): 500 mg via ORAL
  Filled 2017-07-26 (×3): qty 1

## 2017-07-26 MED ORDER — KETOROLAC TROMETHAMINE 15 MG/ML IJ SOLN: 15.0000 mg | INTRAMUSCULAR | Status: DC

## 2017-07-26 MED ORDER — PROPOFOL 10 MG/ML IV BOLUS
INTRAVENOUS | Status: DC | PRN
  Administered 2017-07-26: 180 mg via INTRAVENOUS
  Administered 2017-07-26: 20 mg via INTRAVENOUS

## 2017-07-26 MED ORDER — BISACODYL 5 MG PO TBEC: 5.0000 mg | DELAYED_RELEASE_TABLET | Freq: Every day | ORAL | Status: DC | PRN

## 2017-07-26 MED ORDER — HYDROMORPHONE HCL 2 MG/ML IJ SOLN
0.2500 mg | INTRAMUSCULAR | Status: DC | PRN
  Administered 2017-07-26 (×4): 0.5 mg via INTRAVENOUS

## 2017-07-26 MED ORDER — TECHNETIUM TC 99M SULFUR COLLOID FILTERED
1.0000 | Freq: Once | INTRAVENOUS | Status: AC | PRN
  Administered 2017-07-26: 1 via INTRADERMAL

## 2017-07-26 MED ORDER — SODIUM CHLORIDE 0.9 % IV SOLN: INTRAVENOUS | Status: DC

## 2017-07-26 MED ORDER — FENTANYL CITRATE (PF) 250 MCG/5ML IJ SOLN
INTRAMUSCULAR | Status: DC | PRN
  Administered 2017-07-26 (×5): 25 ug via INTRAVENOUS

## 2017-07-26 MED ORDER — ENSURE PRE-SURGERY PO LIQD
296.0000 mL | Freq: Once | ORAL | Status: DC
  Filled 2017-07-26: qty 296

## 2017-07-26 MED ORDER — DEXAMETHASONE SODIUM PHOSPHATE 4 MG/ML IJ SOLN
INTRAMUSCULAR | Status: DC | PRN
  Administered 2017-07-26: 5 mg via INTRAVENOUS

## 2017-07-26 MED ORDER — ONDANSETRON HCL 4 MG/2ML IJ SOLN: 4.0000 mg | Freq: Four times a day (QID) | INTRAMUSCULAR | Status: DC | PRN

## 2017-07-26 MED ORDER — METOCLOPRAMIDE HCL 5 MG/ML IJ SOLN: 10.0000 mg | Freq: Once | INTRAMUSCULAR | Status: DC | PRN

## 2017-07-26 MED ORDER — CEFAZOLIN SODIUM-DEXTROSE 2-4 GM/100ML-% IV SOLN
2.0000 g | INTRAVENOUS | Status: AC
  Administered 2017-07-26: 2 g via INTRAVENOUS
  Filled 2017-07-26: qty 100

## 2017-07-26 MED ORDER — DOCUSATE SODIUM 100 MG PO CAPS
100.0000 mg | ORAL_CAPSULE | Freq: Two times a day (BID) | ORAL | Status: DC
  Administered 2017-07-27: 100 mg via ORAL
  Filled 2017-07-26 (×2): qty 1

## 2017-07-26 MED ORDER — ONDANSETRON HCL 4 MG/2ML IJ SOLN
INTRAMUSCULAR | Status: DC | PRN
  Administered 2017-07-26: 4 mg via INTRAVENOUS

## 2017-07-26 MED ORDER — SIMETHICONE 80 MG PO CHEW: 40.0000 mg | CHEWABLE_TABLET | Freq: Four times a day (QID) | ORAL | Status: DC | PRN

## 2017-07-26 MED ORDER — ACETAMINOPHEN 500 MG PO TABS
1000.0000 mg | ORAL_TABLET | Freq: Four times a day (QID) | ORAL | Status: DC
  Administered 2017-07-26 – 2017-07-27 (×3): 1000 mg via ORAL
  Filled 2017-07-26 (×3): qty 2

## 2017-07-26 MED ORDER — SCOPOLAMINE 1 MG/3DAYS TD PT72: 1.0000 | MEDICATED_PATCH | TRANSDERMAL | Status: DC

## 2017-07-26 MED ORDER — ONDANSETRON 4 MG PO TBDP: 4.0000 mg | ORAL_TABLET | Freq: Four times a day (QID) | ORAL | Status: DC | PRN

## 2017-07-26 MED ORDER — LIDOCAINE HCL (CARDIAC) PF 100 MG/5ML IV SOSY
PREFILLED_SYRINGE | INTRAVENOUS | Status: DC | PRN
  Administered 2017-07-26: 40 mg via INTRAVENOUS

## 2017-07-26 MED ORDER — LIDOCAINE 2% (20 MG/ML) 5 ML SYRINGE
INTRAMUSCULAR | Status: AC
  Filled 2017-07-26: qty 5

## 2017-07-26 MED ORDER — LIDOCAINE 2% (20 MG/ML) 5 ML SYRINGE
INTRAMUSCULAR | Status: AC
  Filled 2017-07-26: qty 10

## 2017-07-26 MED ORDER — HYDROCODONE-ACETAMINOPHEN 7.5-325 MG PO TABS: 1.0000 | ORAL_TABLET | Freq: Once | ORAL | Status: DC | PRN

## 2017-07-26 MED ORDER — FENTANYL CITRATE (PF) 100 MCG/2ML IJ SOLN
INTRAMUSCULAR | Status: AC
  Administered 2017-07-26: 100 ug
  Filled 2017-07-26: qty 2

## 2017-07-26 MED ORDER — HYDROMORPHONE HCL 2 MG/ML IJ SOLN
INTRAMUSCULAR | Status: AC
  Filled 2017-07-26: qty 1

## 2017-07-26 MED ORDER — ONDANSETRON HCL 4 MG/2ML IJ SOLN
INTRAMUSCULAR | Status: AC
  Filled 2017-07-26: qty 4

## 2017-07-26 MED ORDER — POLYETHYLENE GLYCOL 3350 17 G PO PACK: 17.0000 g | PACK | Freq: Every day | ORAL | Status: DC | PRN

## 2017-07-26 MED ORDER — ACETAMINOPHEN 500 MG PO TABS
1000.0000 mg | ORAL_TABLET | ORAL | Status: AC
  Administered 2017-07-26: 1000 mg via ORAL
  Filled 2017-07-26: qty 2

## 2017-07-26 MED ORDER — MORPHINE SULFATE (PF) 2 MG/ML IV SOLN: 1.0000 mg | INTRAVENOUS | Status: DC | PRN

## 2017-07-26 MED ORDER — MIDAZOLAM HCL 2 MG/2ML IJ SOLN
INTRAMUSCULAR | Status: AC
  Administered 2017-07-26: 2 mg
  Filled 2017-07-26: qty 2

## 2017-07-26 MED ORDER — 0.9 % SODIUM CHLORIDE (POUR BTL) OPTIME
TOPICAL | Status: DC | PRN
  Administered 2017-07-26: 1000 mL

## 2017-07-26 MED ORDER — MIDAZOLAM HCL 2 MG/2ML IJ SOLN
INTRAMUSCULAR | Status: AC
  Filled 2017-07-26: qty 2

## 2017-07-26 MED ORDER — LACTATED RINGERS IV SOLN
INTRAVENOUS | Status: DC
  Administered 2017-07-26: 09:00:00 via INTRAVENOUS

## 2017-07-26 MED ORDER — GABAPENTIN 300 MG PO CAPS
300.0000 mg | ORAL_CAPSULE | ORAL | Status: AC
  Administered 2017-07-26: 300 mg via ORAL
  Filled 2017-07-26: qty 1

## 2017-07-26 SURGICAL SUPPLY — 56 items
APPLIER CLIP 9.375 MED OPEN (MISCELLANEOUS)
BINDER BREAST XLRG (GAUZE/BANDAGES/DRESSINGS) ×2 IMPLANT
BIOPATCH RED 1 DISK 7.0 (GAUZE/BANDAGES/DRESSINGS) ×4 IMPLANT
CANISTER SUCT 3000ML PPV (MISCELLANEOUS) ×2 IMPLANT
CHLORAPREP W/TINT 26ML (MISCELLANEOUS) ×4 IMPLANT
CLIP APPLIE 9.375 MED OPEN (MISCELLANEOUS) IMPLANT
CONT SPEC 4OZ CLIKSEAL STRL BL (MISCELLANEOUS) ×4 IMPLANT
COVER PROBE W GEL 5X96 (DRAPES) ×2 IMPLANT
COVER SURGICAL LIGHT HANDLE (MISCELLANEOUS) ×2 IMPLANT
DERMABOND ADVANCED (GAUZE/BANDAGES/DRESSINGS) ×1
DERMABOND ADVANCED .7 DNX12 (GAUZE/BANDAGES/DRESSINGS) ×1 IMPLANT
DRAIN CHANNEL 19F RND (DRAIN) ×4 IMPLANT
DRAPE LAPAROSCOPIC ABDOMINAL (DRAPES) ×2 IMPLANT
DRAPE ORTHO SPLIT 77X108 STRL (DRAPES) ×2
DRAPE SURG ORHT 6 SPLT 77X108 (DRAPES) ×2 IMPLANT
DRAPE UTILITY XL STRL (DRAPES) ×2 IMPLANT
ELECT BLADE 4.0 EZ CLEAN MEGAD (MISCELLANEOUS) ×2
ELECT CAUTERY BLADE 6.4 (BLADE) ×2 IMPLANT
ELECT REM PT RETURN 9FT ADLT (ELECTROSURGICAL) ×2
ELECTRODE BLDE 4.0 EZ CLN MEGD (MISCELLANEOUS) ×1 IMPLANT
ELECTRODE REM PT RTRN 9FT ADLT (ELECTROSURGICAL) ×1 IMPLANT
EVACUATOR SILICONE 100CC (DRAIN) ×4 IMPLANT
GAUZE SPONGE 4X4 12PLY STRL (GAUZE/BANDAGES/DRESSINGS) ×2 IMPLANT
GLOVE BIO SURGEON STRL SZ7 (GLOVE) ×4 IMPLANT
GLOVE BIOGEL PI IND STRL 7.5 (GLOVE) ×1 IMPLANT
GLOVE BIOGEL PI INDICATOR 7.5 (GLOVE) ×1
GOWN STRL REUS W/ TWL LRG LVL3 (GOWN DISPOSABLE) ×3 IMPLANT
GOWN STRL REUS W/TWL LRG LVL3 (GOWN DISPOSABLE) ×3
KIT BASIN OR (CUSTOM PROCEDURE TRAY) ×2 IMPLANT
KIT TURNOVER KIT B (KITS) ×2 IMPLANT
MARKER SKIN DUAL TIP RULER LAB (MISCELLANEOUS) ×4 IMPLANT
NEEDLE 18GX1X1/2 (RX/OR ONLY) (NEEDLE) IMPLANT
NEEDLE FILTER BLUNT 18X 1/2SAF (NEEDLE)
NEEDLE FILTER BLUNT 18X1 1/2 (NEEDLE) IMPLANT
NEEDLE HYPO 25GX1X1/2 BEV (NEEDLE) IMPLANT
NS IRRIG 1000ML POUR BTL (IV SOLUTION) ×2 IMPLANT
PACK GENERAL/GYN (CUSTOM PROCEDURE TRAY) ×2 IMPLANT
PAD ABD 8X10 STRL (GAUZE/BANDAGES/DRESSINGS) ×4 IMPLANT
PAD ARMBOARD 7.5X6 YLW CONV (MISCELLANEOUS) ×2 IMPLANT
PIN SAFETY STERILE (MISCELLANEOUS) ×2 IMPLANT
SPECIMEN JAR X LARGE (MISCELLANEOUS) ×2 IMPLANT
SPONGE LAP 18X18 X RAY DECT (DISPOSABLE) ×2 IMPLANT
STAPLER VISISTAT 35W (STAPLE) ×2 IMPLANT
STRIP CLOSURE SKIN 1/2X4 (GAUZE/BANDAGES/DRESSINGS) ×8 IMPLANT
SUT ETHILON 2 0 FS 18 (SUTURE) ×2 IMPLANT
SUT MON AB 4-0 PC3 18 (SUTURE) ×6 IMPLANT
SUT SILK 2 0 SH (SUTURE) IMPLANT
SUT VIC AB 0 CT1 27 (SUTURE) ×5
SUT VIC AB 0 CT1 27XBRD ANBCTR (SUTURE) ×5 IMPLANT
SUT VIC AB 3-0 54X BRD REEL (SUTURE) ×1 IMPLANT
SUT VIC AB 3-0 BRD 54 (SUTURE) ×1
SUT VIC AB 3-0 SH 18 (SUTURE) ×2 IMPLANT
SUT VIC AB 3-0 SH 8-18 (SUTURE) ×2 IMPLANT
SYR CONTROL 10ML LL (SYRINGE) IMPLANT
TOWEL OR 17X24 6PK STRL BLUE (TOWEL DISPOSABLE) ×2 IMPLANT
TOWEL OR 17X26 10 PK STRL BLUE (TOWEL DISPOSABLE) ×2 IMPLANT

## 2017-07-26 NOTE — H&P (Signed)
Caroline Matthews referred by Dr Lisbeth Renshaw for new left breast cancer. she has family history of breast cancer in [place] who passed away age 29. she lives in [place] and works as a Programme researcher, broadcasting/film/video at [employer]. she does smoke marijuana daily. she has had bloody dc on the left for the past 2 years. she also has spontaneous dc on the left that is cream colored. she has multiple masses on the left that have been palpable for several years and have been increasing in size. she has not sought treatment due to lack of insurance. she has no personal history of breast disease. she underwent evaluation with a mm. she has dense breasts. she has multiple masses. at 230 there is a 1.9x1.5x1.2 cm mass, at 245 there is a 1.5x1.3x1 cm mass and at 2 there is a 8x7x7 mm mass (this has not been biopsied). her axilla is negative by [place]. there is also 9 cm of calcs on mm and the posterior area here has been biopsied. 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 she is here with her mom and grandmother to discuss options today   Past Surgical History  Breast Biopsy  Left. Oral Surgery    Diagnostic Studies History  Mammogram  within last year Pap Smear  1-5 years ago  Allergies  No Known Allergies  Medication History Medications Reconciled  Social History  Alcohol use  Heavy alcohol use. Caffeine use  Tea. Illicit drug use  Uses daily. Tobacco use  Former smoker.  Family History  Alcohol Abuse  Family Members In General. Arthritis  Family Members In General, Mother. Breast Cancer  Family Members In General. Cerebrovascular Accident  Family Members In General, Father. Depression  Brother, Mother. Diabetes Mellitus  Family Members In General. Hypertension  Family Members In General. Thyroid problems  Family Members In General.  Pregnancy / Birth History  Age at menarche  27 years. Irregular periods   Other Problems   Back Pain  Breast Cancer  Lump In Breast  Thyroid Disease   Review of Systems General Present- Chills and Weight Gain. Not Present- Appetite Loss, Fatigue, Fever, Night Sweats and Weight Loss. Skin Present- Dryness, Hives and Rash. Not Present- Change in Wart/Mole, Jaundice, New Lesions, Non-Healing Wounds and Ulcer. HEENT Present- Seasonal Allergies. Not Present- Earache, Hearing Loss, Hoarseness, Nose Bleed, Oral Ulcers, Ringing in the Ears, Sinus Pain, Sore Throat, Visual Disturbances, Wears glasses/contact lenses and Yellow Eyes. Respiratory Present- Snoring. Not Present- Bloody sputum, Chronic Cough, Difficulty Breathing and Wheezing. Breast Present- Breast Mass, Breast Pain, Nipple Discharge and Skin Changes. Cardiovascular Present- Leg Cramps, Palpitations, Rapid Heart Rate, Shortness of Breath and Swelling of Extremities. Not Present- Chest Pain and Difficulty Breathing Lying Down. Female Genitourinary Not Present- Frequency, Nocturia, Painful Urination, Pelvic Pain and Urgency. Musculoskeletal Present- Back Pain and Muscle Weakness. Not Present- Joint Pain, Joint Stiffness, Muscle Pain and Swelling of Extremities. Neurological Present- Headaches, Numbness and Tingling. Not Present- Decreased Memory, Fainting, Seizures, Tremor, Trouble walking and Weakness. Psychiatric Not Present- Anxiety, Bipolar, Change in Sleep Pattern, Depression, Fearful and Frequent crying. Endocrine Present- Excessive Hunger. Not Present- Cold Intolerance, Hair Changes, Heat Intolerance, Hot flashes and New Diabetes. Hematology Present- Easy Bruising and Excessive bleeding. Not Present- Blood Thinners, Gland problems, HIV and Persistent Infections.   Physical Exam  General Mental Status-Alert. Head and Neck Trachea-midline. Thyroid Gland Characteristics - normal size and consistency.  Eye Sclera/Conjunctiva - Bilateral-No scleral icterus. Chest and Lung Exam Chest and lung exam reveals  -quiet, even and easy respiratory effort with no use of accessory muscles and on auscultation, normal breath sounds, no adventitious sounds and normal vocal resonance. Breast Nipples-No Discharge. Cardiovascular Cardiovascular examination reveals -normal heart sounds, regular rate and rhythm with no murmurs and no digital clubbing, cyanosis, edema, increased warmth or tenderness. Abdomen Note: soft no hepatomegaly Neurologic Neurologic evaluation reveals -alert and oriented x 3 with no impairment of recent or remote memory. Lymphatic Head & Neck General Head & Neck Lymphatics: Bilateral - Description - Normal. Axillary General Axillary Region: Bilateral - Description - Normal. Note: no [place] adenopathy  Assessment & Plan BREAST CANCER OF UPPER-OUTER QUADRANT OF LEFT FEMALE BREAST (C50.412) left total mastectomy and sn biopsy,right mastectomy We discussed the staging and pathophysiology of breast cancer. We discussed all of the different options for treatment for breast cancer including surgery, chemotherapy, radiation therapy, Herceptin, and antiestrogen therapy. We discussed a sentinel lymph node biopsy as she does not appear to having lymph node involvement right now. We discussed the performance of that with injection of radioactive tracer. We discussed that there is a chance of having a positive node with a sentinel lymph node biopsy and we will await the permanent pathology to make any other first further decisions in terms of her treatment. One of these options might be to return to the operating room to perform an axillary lymph node dissection. We discussed up to a 5% risk lifetime of chronic shoulder pain as well as lymphedema associated with a sentinel lymph node biopsy. I do think she needs a mastectomy on the left side and cannot have nsm. she does not desire reconstruction at all. we discussed mastectomy today. she also desires right mastectomy

## 2017-07-26 NOTE — Progress Notes (Signed)
Admitted to 6n30 from PACU. Drowsy but easily arousable. Family at bedside.

## 2017-07-26 NOTE — Op Note (Signed)
Preoperative diagnosis: left breast cancer Postoperative diagnosis: saa Procedure: 1. Left total mastectomy 2. Left deep axillary sentinel node biopsy 3. Right total mastectomy Surgeon: Dr Serita Grammes Asst: Arrie Aran Complications none Drains 19 Fr blake drain bilaterally Specimens:  1. Right mastectomy short superior, long lateral 2. Left mastectomy short superior, long lateral 3. Left axillary sentinel nodes with highest count 1431 Sponge and needle count correct at completion dispo to recovery stable.  Indications: This is a 27 year old female with new diagnosis of left breast cancer.   On the left side that this will require a mastectomy.  She desired a right mastectomy due to her age and for symmetry.  We did also discussed a left axillary sentinel lymph node biopsy.  She declined any reconstruction.  Procedure: After informed consent was obtained the patient was taken the operating room.  She underwent bilateral pectoral blocks.  She was given antibiotics.  SCDs were in place.  She had technetium injected on the left side in the periareolar fashion.  She was then placed under general anesthesia without complication.  Her she was prepped and draped in the standard sterile surgical fashion.  A surgical timeout was then performed.  I did the right mastectomy first.    I elected to do this through a reduction pattern incision.  I measured above the nipple and areola and marked out the incision.  I then made the incision and created flaps to the parasternal region, clavicle, latissimus, and inframammary crease.  I then remove the breast tissue, nipple, areola, and the skin from the pectoralis muscle to include the pectoralis fascia.  This was marked as above and passed off the table.  I closed the lateral tissue with 0 vicryl to decrease the dead space. I then obtained hemostasis.  A 19 [place] Blake drain was placed.  I secured this with a 2-0 nylon suture and placed a Biopatch and a  Tegaderm over it later.  This was then closed with 3-0 Vicryl.  The skin was closed with 4-0 Monocryl.  Steri-Strips and glue were placed.  I then performed a left mastectomy.  I made a similar type incision on the left side.  I created flaps in a similar fashion as well.   I then removed the breast tissue to include the pectoralis fascia from the pectoralis muscle and passed this off the table marked as above.  I then entered into the axilla.  There were sentinel nodes that were removed with the highest count as listed above.  There were no background radioactivity wide complete in this.  I then closed the axilla with 0 Vicryl.  I then closed the lateral tissue with 0 Vicryl sutures to close down the space.   I placed a 32 [place] Blake drain secured this with a 2-0 nylon suture.  I then closed this with 3-0 Vicryl and 4-0 Monocryl.  Steri-Strips and glue were placed.  She tolerated this well was extubated and transferred to the recovery room in stable condition.

## 2017-07-26 NOTE — Transfer of Care (Signed)
Immediate Anesthesia Transfer of Care Note  Patient: Caroline Matthews  Procedure(s) Performed: BILATERAL TOTAL MASTECTOMIES WITH LEFT SENTINEL LYMPH NODE BIOPSY (Bilateral Breast)  Patient Location: PACU  Anesthesia Type:General  Level of Consciousness: awake, alert , patient cooperative and responds to stimulation  Airway & Oxygen Therapy: Patient Spontanous Breathing and Patient connected to face mask oxygen  Post-op Assessment: Report given to RN, Post -op Vital signs reviewed and stable and Patient moving all extremities X 4  Post vital signs: Reviewed and stable  Last Vitals:  Vitals Value Taken Time  BP 132/107 07/26/2017  1:16 PM  Temp 36.7 C 07/26/2017  1:16 PM  Pulse 57 07/26/2017  1:16 PM  Resp 20 07/26/2017  1:16 PM  SpO2 99 % 07/26/2017  1:16 PM  Vitals shown include unvalidated device data.  Last Pain:  Vitals:   07/26/17 1316  PainSc: (P) Asleep         Complications: No apparent anesthesia complications

## 2017-07-26 NOTE — Anesthesia Procedure Notes (Signed)
Anesthesia Regional Block: Pectoralis block   Pre-Anesthetic Checklist: ,, timeout performed, Correct Patient, Correct Site, Correct Laterality, Correct Procedure, Correct Position, site marked, Risks and benefits discussed,  Surgical consent,  Pre-op evaluation,  At surgeon's request and post-op pain management  Laterality: Left  Prep: chloraprep       Needles:  Injection technique: Single-shot  Needle Type: Echogenic Stimulator Needle     Needle Length: 9cm  Needle Gauge: 21   Needle insertion depth: 6 cm   Additional Needles:   Procedures:,,,, ultrasound used (permanent image in chart),,,,  Narrative:  Start time: 07/26/2017 9:10 AM End time: 07/26/2017 9:15 AM Injection made incrementally with aspirations every 5 mL.  Performed by: Personally  Anesthesiologist: Josephine Igo, MD  Additional Notes: Timeout performed. Patient sedated. Relevant anatomy ID'd using [place]. Incremental 2-44ml injection of LA with frequent aspiration. Patient tolerated procedure well.        Left Pectoralis Block

## 2017-07-26 NOTE — Anesthesia Postprocedure Evaluation (Signed)
Anesthesia Post Note  Patient: Caroline Matthews  Procedure(s) Performed: BILATERAL TOTAL MASTECTOMIES WITH LEFT SENTINEL LYMPH NODE BIOPSY (Bilateral Breast)     Patient location during evaluation: PACU Anesthesia Type: General Level of consciousness: awake and alert and oriented Pain management: pain level controlled Vital Signs Assessment: post-procedure vital signs reviewed and stable Respiratory status: spontaneous breathing, nonlabored ventilation, respiratory function stable and patient connected to nasal cannula oxygen Cardiovascular status: blood pressure returned to baseline and stable Postop Assessment: no apparent nausea or vomiting Anesthetic complications: no    Last Vitals:  Vitals:   07/26/17 1356 07/26/17 1412  BP: (!) 140/99 (!) 153/95  Pulse: (!) 57 65  Resp: 15 15  Temp: 36.6 C 36.7 C  SpO2: 99% 100%    Last Pain:  Vitals:   07/26/17 1433  TempSrc:   PainSc: Asleep                 Valoria Tamburri A.

## 2017-07-26 NOTE — Anesthesia Preprocedure Evaluation (Signed)
Anesthesia Evaluation  Patient identified by MRN, date of birth, ID band Patient awake    Reviewed: Allergy & Precautions, NPO status , Patient's Chart, lab work & pertinent test results  Airway Mallampati: II  TM Distance: >3 FB Neck ROM: Full    Dental  (+) Teeth Intact   Pulmonary former smoker,    Pulmonary exam normal breath sounds clear to auscultation       Cardiovascular negative cardio ROS Normal cardiovascular exam Rhythm:Regular Rate:Normal     Neuro/Psych Anxiety negative neurological ROS     GI/Hepatic negative GI ROS, Neg liver ROS,   Endo/Other  Left Breast Ca Obesity  Renal/GU negative Renal ROS  negative genitourinary   Musculoskeletal negative musculoskeletal ROS (+)   Abdominal (+) + obese,   Peds  Hematology negative hematology ROS (+)   Anesthesia Other Findings   Reproductive/Obstetrics                             Anesthesia Physical Anesthesia Plan  ASA: II  Anesthesia Plan: General   Post-op Pain Management:  Regional for Post-op pain   Induction: Intravenous  PONV Risk Score and Plan: 4 or greater and Scopolamine patch - Pre-op, Midazolam, Dexamethasone, Ondansetron and Treatment may vary due to age or medical condition  Airway Management Planned: LMA  Additional Equipment:   Intra-op Plan:   Post-operative Plan: Extubation in OR  Informed Consent: I have reviewed the patients History and Physical, chart, labs and discussed the procedure including the risks, benefits and alternatives for the proposed anesthesia with the patient or authorized representative who has indicated his/her understanding and acceptance.   Dental advisory given  Plan Discussed with: CRNA and Surgeon  Anesthesia Plan Comments: (Bilateral Pec blocks)        Anesthesia Quick Evaluation

## 2017-07-26 NOTE — Progress Notes (Signed)
Urine preg neg. dos

## 2017-07-26 NOTE — Interval H&P Note (Signed)
History and Physical Interval Note:  07/26/2017 9:27 AM  Caroline Matthews  has presented today for surgery, with the diagnosis of left breast cancer  The various methods of treatment have been discussed with the patient and family. After consideration of risks, benefits and other options for treatment, the patient has consented to  Procedure(s) with comments: BILATERAL TOTAL MASTECTOMIES WITH LEFT SENTINEL LYMPH NODE BIOPSY (Bilateral) - BILATERAL PEC BLOCKS as a surgical intervention .  The patient's history has been reviewed, patient examined, no change in status, stable for surgery.  I have reviewed the patient's chart and labs.  Questions were answered to the patient's satisfaction.     Rolm Bookbinder

## 2017-07-26 NOTE — Progress Notes (Signed)
Give toradol in or if needed per dr foster

## 2017-07-26 NOTE — Anesthesia Procedure Notes (Signed)
Anesthesia Regional Block: Pectoralis block   Pre-Anesthetic Checklist: ,, timeout performed, Correct Patient, Correct Site, Correct Laterality, Correct Procedure, Correct Position, site marked, Risks and benefits discussed,  Surgical consent,  Pre-op evaluation,  At surgeon's request and post-op pain management  Laterality: Right  Prep: chloraprep       Needles:  Injection technique: Single-shot  Needle Type: Echogenic Stimulator Needle     Needle Length: 9cm  Needle Gauge: 21   Needle insertion depth: 7 cm   Additional Needles:   Procedures:,,,, ultrasound used (permanent image in chart),,,,  Narrative:  Start time: 07/26/2017 9:16 AM End time: 07/26/2017 9:21 AM Injection made incrementally with aspirations every 5 mL.  Performed by: Personally  Anesthesiologist: Josephine Igo, MD  Additional Notes: Timeout performed. Patient sedated. Relevant anatomy ID'd using [place]. Incremental 2-24ml injection of LA with frequent aspiration. Patient tolerated procedure well.        Right Pectoralis Block

## 2017-07-26 NOTE — Anesthesia Procedure Notes (Signed)
Procedure Name: LMA Insertion Date/Time: 07/26/2017 10:11 AM Performed by: Glynda Jaeger, CRNA Pre-anesthesia Checklist: Patient identified, Emergency Drugs available, Suction available, Timeout performed and Patient being monitored Patient Re-evaluated:Patient Re-evaluated prior to induction Oxygen Delivery Method: Circle system utilized Preoxygenation: Pre-oxygenation with 100% oxygen Induction Type: IV induction Ventilation: Mask ventilation without difficulty LMA: LMA inserted LMA Size: 4.0 Number of attempts: 1 Tube secured with: Tape Dental Injury: Teeth and Oropharynx as per pre-operative assessment

## 2017-07-27 ENCOUNTER — Encounter (HOSPITAL_COMMUNITY): Payer: Self-pay | Admitting: General Surgery

## 2017-07-27 DIAGNOSIS — D0512 Intraductal carcinoma in situ of left breast: Secondary | ICD-10-CM | POA: Diagnosis not present

## 2017-07-27 MED ORDER — METHOCARBAMOL 500 MG PO TABS: 500.0000 mg | ORAL_TABLET | Freq: Three times a day (TID) | ORAL | 0 refills | Status: DC | PRN

## 2017-07-27 MED ORDER — OXYCODONE HCL 5 MG PO TABS: 5.0000 mg | ORAL_TABLET | ORAL | 0 refills | Status: DC | PRN

## 2017-07-27 NOTE — Discharge Instructions (Signed)
[facility], [place] [phone]  MASTECTOMY: POST OP INSTRUCTIONS  Take ibuprofen 400 mg every 8 hours for next 3 days and then as needed. Take the robaxin every 8 hours for next three days also and then as needed. Use the oxycodone in addition to that as needed.   You may shower in 48 hours and get dressings and drains wet. Pat dry and reapply binder.   Always review your discharge instruction sheet given to you by the facility where your surgery was performed. IF YOU HAVE DISABILITY OR FAMILY LEAVE FORMS, YOU MUST BRING THEM TO THE OFFICE FOR PROCESSING.   DO NOT GIVE THEM TO YOUR DOCTOR. A prescription for pain medication may be given to you upon discharge.  Take your pain medication as prescribed, if needed.  If narcotic pain medicine is not needed, then you may take acetaminophen (Tylenol), naprosyn (Alleve) or ibuprofen (Advil) as needed. 1. Take your usually prescribed medications unless otherwise directed. 2. If you need a refill on your pain medication, please contact your pharmacy.  They will contact our office to request authorization.  Prescriptions will not be filled after 5pm or on week-ends. 3. .  Resume your normal diet the day after surgery. 4. Most patients will experience some swelling and bruising on the chest and underarm.  Ice packs will help.  Swelling and bruising can take several days to resolve. Wear the binder day and night until you return to the office.  5. It is common to experience some constipation if taking pain medication after surgery.  Increasing fluid intake and taking a stool softener (such as Colace) will usually help or prevent this problem from occurring.  A mild laxative (Milk of Magnesia or Miralax) should be taken according to package instructions if there are no bowel movements after 48 hours. 6. Unless discharge instructions indicate otherwise, leave your bandage dry and in place until your next appointment in 3-5 days.  You may take a  limited sponge bath.  No tube baths or showers until the drains are removed.  You may have steri-strips (small skin tapes) in place directly over the incision.  These strips should be left on the skin for 7-10 days. If you have glue it will come off in next couple week.  Any sutures will be removed at an office visit 7. DRAINS:  If you have drains in place, it is important to keep a list of the amount of drainage produced each day in your drains.  Before leaving the hospital, you should be instructed on drain care.  Call our office if you have any questions about your drains. I will remove your drains when they put out less than 30 cc or ml for 2 consecutive days. 8. ACTIVITIES:  You may resume regular (light) daily activities beginning the next day--such as daily self-care, walking, climbing stairs--gradually increasing activities as tolerated.  You may have sexual intercourse when it is comfortable.  Refrain from any heavy lifting or straining until approved by your doctor. a. You may drive when you are no longer taking prescription pain medication, you can comfortably wear a seatbelt, and you can safely maneuver your car and apply brakes. b. RETURN TO WORK:  __________________________________________________________ 9. You should see your doctor in the office for a follow-up appointment approximately 3-5 days after your surgery.  Your doctors nurse will typically make your follow-up appointment when she calls you with your pathology report.  Expect your pathology report 3-4business days after surgery. 10.  OTHER INSTRUCTIONS: ______________________________________________________________________________________________ ____________________________________________________________________________________________ WHEN TO CALL YOUR DR Santresa Levett: 1. Fever over 101.0 2. Nausea and/or vomiting 3. Extreme swelling or bruising 4. Continued bleeding from incision. 5. Increased pain, redness, or drainage from  the incision. The clinic staff is available to answer your questions during regular business hours.  Please dont hesitate to call and ask to speak to one of the nurses for clinical concerns.  If you have a medical emergency, go to the nearest emergency room or call 911.  A surgeon from [facility] is always on call at the hospital. [address] [place], [place], [place]  [address] ? [address], [place], [place]   [address] [phone] ? [phone] ? FAX [phone] Web site: [email or web address]

## 2017-07-27 NOTE — Discharge Summary (Signed)
Physician Discharge Summary  Patient ID: Caroline Matthews MRN: [id number] DOB/AGE: 1990/05/28 27 y.o.  Admit date: 07/26/2017 Discharge date: 07/27/2017  Admission Diagnoses: Left breast cancer  Discharge Diagnoses:  Active Problems:   Breast cancer, left [facility])   Discharged Condition: good  Hospital Course: 49 yof underwent bilateral mastectomies and left ax sn biopsy. Doing well the following am and will be discharged home.   Consults: None  Significant Diagnostic Studies: none  Treatments: surgery: bilateral mastectomies, left ax sn biopsy  Discharge Exam: Blood pressure 113/74, pulse 68, temperature 98.4 F (36.9 C), temperature source Oral, resp. rate 15, last menstrual period 07/16/2017, SpO2 100 %. Chest wall: bilateral anchor incisions without infection, skin viable, drains serosang as expected  Disposition: Discharge disposition: 01-Home or Self Care        Allergies as of 07/27/2017   No Known Allergies     Medication List    STOP taking these medications   tamoxifen 20 MG tablet Commonly known as:  NOLVADEX     TAKE these medications   methocarbamol 500 MG tablet Commonly known as:  ROBAXIN Take 1 tablet (500 mg total) by mouth every 8 (eight) hours as needed for muscle spasms.   oxyCODONE 5 MG immediate release tablet Commonly known as:  Oxy IR/ROXICODONE Take 1 tablet (5 mg total) by mouth every 4 (four) hours as needed for moderate pain.      Follow-up Information    Rolm Bookbinder, MD In 2 weeks.   Specialty:  General Surgery Contact information: [place] [address] [phone]           Signed: Rolm Bookbinder 07/27/2017, 8:51 AM

## 2017-07-28 ENCOUNTER — Telehealth: Payer: Self-pay | Admitting: *Deleted

## 2017-07-28 NOTE — Telephone Encounter (Signed)
Received order for oncotype testing per Dr. Lindi Adie. Requisition faxed to pathology. Received by Varney Biles.

## 2017-07-29 NOTE — Telephone Encounter (Signed)
Revealed negative genetic testing.   This normal result is reassuring and means we did not find a genetic/hereditary cause for her breast cancer.  However her personal and family history is still somewhat suspicious, and genetic testing is not perfect. This result does not definitively rule out a hereditary cause. We would therefore recommend her family members discuss increased breast screening with their doctors and we recommend her maternal relatives also consider genetic testing due to the family history on that side.  She should follow all recommendations provided to her by her physicians regarding cancer treatment and screening in the future.   It will be important for her to keep in contact with genetics to learn if any additional testing may be needed in the future.

## 2017-07-30 ENCOUNTER — Encounter: Payer: Self-pay | Admitting: Genetics

## 2017-07-30 ENCOUNTER — Ambulatory Visit: Payer: Self-pay | Admitting: Genetics

## 2017-07-30 DIAGNOSIS — Z803 Family history of malignant neoplasm of breast: Secondary | ICD-10-CM

## 2017-07-30 DIAGNOSIS — Z1379 Encounter for other screening for genetic and chromosomal anomalies: Secondary | ICD-10-CM

## 2017-07-30 DIAGNOSIS — C50412 Malignant neoplasm of upper-outer quadrant of left female breast: Secondary | ICD-10-CM

## 2017-07-30 DIAGNOSIS — Z170 Estrogen receptor positive status [ER+]: Principal | ICD-10-CM

## 2017-07-30 NOTE — Progress Notes (Signed)
HPI:  Ms. Boden was previously seen in the [place] clinic on 06/24/2017 due to a personal and family history of breast cancer and concerns regarding a hereditary predisposition to cancer. Please refer to our prior cancer genetics clinic note for more information regarding Ms. Peitz's medical, social and family histories, and our assessment and recommendations, at the time. Ms. Glennie recent genetic test results were disclosed to her, as well as recommendations warranted by these results. These results and recommendations are discussed in more detail below.  CANCER HISTORY:    Malignant neoplasm of upper-outer quadrant of left breast in female, estrogen receptor positive ([place])   06/15/2017 Initial Diagnosis    Palpable left breast lump with bloody nipple discharge for the past 5 years extremely dense breasts 9 cm area of calcifications ultrasound 2:30 position 1.9 cm and 2:45 position 1.5 cm biopsy-proven grade 1 IDC ER 95%, PR 95%, Ki-67 25%, HER-2 negative ratio 1.1, T1c N0 stage I a clinical stage      07/13/2017 Genetic Testing    The Common Hereditary Cancer Panel offered by [facility] includes sequencing and/or deletion duplication testing of the following 47 genes: APC, ATM, AXIN2, BARD1, BMPR1A, BRCA1, BRCA2, BRIP1, CDH1, CDKN2A (p14ARF), CDKN2A (p16INK4a), CKD4, CHEK2, CTNNA1, DICER1, EPCAM (Deletion/duplication testing only), GREM1 (promoter region deletion/duplication testing only), KIT, MEN1, MLH1, MSH2, MSH3, MSH6, MUTYH, NBN, NF1, NHTL1, PALB2, PDGFRA, PMS2, POLD1, POLE, PTEN, RAD50, RAD51C, RAD51D, SDHB, SDHC, SDHD, SMAD4, SMARCA4. STK11, TP53, TSC1, TSC2, and VHL.  The following genes were evaluated for sequence changes only: SDHA and HOXB13 c.251G>A variant only.  Results: Negative, no pathogenic variants identified.  The date of this test report is 07/13/2017.         FAMILY HISTORY:  We obtained a detailed, 4-generation family history.  Significant diagnoses are  listed below: Family History  Problem Relation Age of Onset  . Heart attack Father   . Breast cancer Maternal Aunt   . Breast cancer Maternal Grandmother        died at 73, dx 38's    Ms. Cronce has no children.  Ms. Krotz has 4 paternal half-brothers in their 26's and 1 maternal half-brother in his 88's.   Ms. Bruney father: is 108 with no history of cancer.  Paternal aunts/Uncles: 1 paternal aunt and 1 paternal ucnle in their 34's/50's with no history of cancer.  Paternal cousins: no history of cancer.  Paternal grandfather: alive in his 36's with no history of cancer.  Paternal grandmother:alive in her 21's, no history of cancer.   Ms. Dina mother: is 54, no history of cancer. Has her uterus and ovaries intact.  Maternal Aunts/Uncles: 1 maternal aunt and 1 maternal uncle with no history of cancer.  Maternal cousins: no history of cancer.  Maternal grandfather: in his 62's with no history of cancer.  Maternal grandmother: died of breast cancer at 37.  This grandmother had maternal aunt who died of breast cancer.   Ms. Kulinski is unaware of previous family history of genetic testing for hereditary cancer risks. Patient's maternal ancestors are of African American descent, and paternal ancestors are of [place] descent. There is no reported Ashkenazi Jewish ancestry. The patient reports her parents may be very distant cousins (87 8th cousins).   GENETIC TEST RESULTS: Genetic testing performed through [facility]'s Common Hereditary Cancers Panel reported out on 07/13/2017 showed no pathogenic mutations. The Common Hereditary Cancer Panel offered by [facility] includes sequencing and/or deletion duplication testing of the following 47 genes:  APC, ATM, AXIN2, BARD1, BMPR1A, BRCA1, BRCA2, BRIP1, CDH1, CDKN2A (p14ARF), CDKN2A (p16INK4a), CKD4, CHEK2, CTNNA1, DICER1, EPCAM (Deletion/duplication testing only), GREM1 (promoter region deletion/duplication testing only), KIT, MEN1, MLH1, MSH2, MSH3,  MSH6, MUTYH, NBN, NF1, NHTL1, PALB2, PDGFRA, PMS2, POLD1, POLE, PTEN, RAD50, RAD51C, RAD51D, SDHB, SDHC, SDHD, SMAD4, SMARCA4. STK11, TP53, TSC1, TSC2, and VHL.  The following genes were evaluated for sequence changes only: SDHA and HOXB13 c.251G>A variant only..  The test report will be scanned into EPIC and will be located under the Molecular Pathology section of the Results Review tab. A portion of the result report is included below for reference.     We discussed with Ms. Shiel that because current genetic testing is not perfect, it is possible there may be a gene mutation in one of these genes that current testing cannot detect, but that chance is small.  We also discussed, that there could be another gene that has not yet been discovered, or that we have not yet tested, that is responsible for the cancer diagnoses in the family. It is also possible there is a hereditary cause for the cancer in the family that Ms. Hillis did not inherit and therefore was not identified in her testing.  Therefore, it is important to remain in touch with cancer genetics in the future so that we can continue to offer Ms. Speelman the most up to date genetic testing.   ADDITIONAL GENETIC TESTING: We discussed with Ms. Heyward that there are other genes that are associated with increased cancer risk that can be analyzed. The laboratories that offer this testing look at these additional genes via a hereditary cancer gene panel. Should Ms. Grist wish to pursue additional genetic testing, we are happy to discuss and coordinate this testing, at any time.    CANCER SCREENING RECOMMENDATIONS: Ms. Imhof test result is considered negative (normal).  This means that we have not identified a hereditary cause for her personal and family history of cancer at this time.   While reassuring, this does not definitively rule out a hereditary predisposition to cancer. It is still possible that there could be genetic mutations that are  undetectable by current technology, or genetic mutations in genes that have not been tested or identified to increase cancer risk.  Her personal and family history of breast cancer at young ages is still suspicious for a hereditary component, and therefore, this normal result may simply be due to limitations in current technology/knoweldge to detect the cause for her cancer.   Therefore, it is recommended she continue to follow the cancer management and screening guidelines provided by her oncology and primary healthcare provider. An individual's cancer risk is not determined by genetic test results alone.  Overall cancer risk assessment includes additional factors such as personal medical history, family history, etc.  These should be used to make a personalized plan for cancer prevention and surveillance.    RECOMMENDATIONS FOR FAMILY MEMBERS:  Relatives in this family are be at some increased risk of developing cancer, over the general population risk, simply due to the family history of cancer.  We recommended women in this family have a yearly mammogram beginning at age 41, or 49 years younger than the earliest onset of cancer, an annual clinical breast exam, and perform monthly breast self-exams. Women in this family should also have a gynecological exam as recommended by their primary provider. All family members should have a colonoscopy by age 51 (or as directed by their doctors).  All  family members should inform their physicians about the family history of cancer so their doctors can make the most appropriate screening recommendations for them.   It is also possible there is a hereditary cause for the cancer in Ms. Lackman's family that she did not inherit and therefore was not identified in her.  We recommended maternal relatives also, have genetic counseling and testing. Ms. Engebretsen will let us know if we can be of any assistance in coordinating genetic counseling and/or testing for these family  members.   FOLLOW-UP: Lastly, we discussed with Ms. Haworth that cancer genetics is a rapidly advancing field and it is possible that new genetic tests will be appropriate for her and/or her family members in the future. We encouraged her to remain in contact with cancer genetics on an annual basis so we can update her personal and family histories and let her know of advances in cancer genetics that may benefit this family.   Our contact number was provided. Ms. Berte questions were answered to her satisfaction, and she knows she is welcome to call us at anytime with additional questions or concerns.   Ferol Luz, MS, [facility] Certified Genetic Counselor [email or web address]

## 2017-08-02 ENCOUNTER — Telehealth: Payer: Self-pay | Admitting: Adult Health

## 2017-08-02 ENCOUNTER — Inpatient Hospital Stay: Payer: PRIVATE HEALTH INSURANCE | Attending: Hematology and Oncology | Admitting: Hematology and Oncology

## 2017-08-02 DIAGNOSIS — C50412 Malignant neoplasm of upper-outer quadrant of left female breast: Secondary | ICD-10-CM

## 2017-08-02 DIAGNOSIS — Z170 Estrogen receptor positive status [ER+]: Secondary | ICD-10-CM | POA: Insufficient documentation

## 2017-08-02 DIAGNOSIS — Z9013 Acquired absence of bilateral breasts and nipples: Secondary | ICD-10-CM | POA: Diagnosis not present

## 2017-08-02 DIAGNOSIS — Z79899 Other long term (current) drug therapy: Secondary | ICD-10-CM | POA: Insufficient documentation

## 2017-08-02 NOTE — Telephone Encounter (Signed)
Gave avs and calendar ° °

## 2017-08-02 NOTE — Progress Notes (Signed)
Patient Care Team: Patient, No Pcp Per as PCP - General (General Practice) Rolm Bookbinder, MD as Consulting Physician (General Surgery) Nicholas Lose, MD as Consulting Physician (Hematology and Oncology) Kyung Rudd, MD as Consulting Physician (Radiation Oncology)  DIAGNOSIS:  Encounter Diagnosis  Name Primary?  . Malignant neoplasm of upper-outer quadrant of left breast in female, estrogen receptor positive ([place])     SUMMARY OF ONCOLOGIC HISTORY:   Malignant neoplasm of upper-outer quadrant of left breast in female, estrogen receptor positive ([place])   06/15/2017 Initial Diagnosis    Palpable left breast lump with bloody nipple discharge for the past 5 years extremely dense breasts 9 cm area of calcifications ultrasound 2:30 position 1.9 cm and 2:45 position 1.5 cm biopsy-proven grade 1 IDC ER 95%, PR 95%, Ki-67 25%, HER-2 negative ratio 1.1, T1c N0 stage I a clinical stage      07/13/2017 Genetic Testing    The Common Hereditary Cancer Panel offered by [facility] includes sequencing and/or deletion duplication testing of the following 47 genes: APC, ATM, AXIN2, BARD1, BMPR1A, BRCA1, BRCA2, BRIP1, CDH1, CDKN2A (p14ARF), CDKN2A (p16INK4a), CKD4, CHEK2, CTNNA1, DICER1, EPCAM (Deletion/duplication testing only), GREM1 (promoter region deletion/duplication testing only), KIT, MEN1, MLH1, MSH2, MSH3, MSH6, MUTYH, NBN, NF1, NHTL1, PALB2, PDGFRA, PMS2, POLD1, POLE, PTEN, RAD50, RAD51C, RAD51D, SDHB, SDHC, SDHD, SMAD4, SMARCA4. STK11, TP53, TSC1, TSC2, and VHL.  The following genes were evaluated for sequence changes only: SDHA and HOXB13 c.251G>A variant only.  Results: Negative, no pathogenic variants identified.  The date of this test report is 07/13/2017.       07/26/2017 Surgery    Bilateral mastectomies: Left mastectomy: grade 2 Multifocal IDC with DCIS,1.8 cm and 1.7 cm, multifocal DCIS intermediate grade with necrosis which is a third focus, margins negative, 0/2 lymph nodes, ER 95%, PR 95%,  Ki-67 25%, HER-2 negative ratio 1.1, T1CN0 stage IA (right mastectomy benign)       CHIEF COMPLIANT: Follow-up to discuss pathology report and the treatment plan  INTERVAL HISTORY: Caroline Matthews is a 26-year with above-mentioned history of bilateral mastectomies for left breast cancer.  She did not have any gene mutations.  She is recovering from the recent surgeries with moderate degree of pain and discomfort.  She is here today to discuss the final pathology report and to determine the appropriate adjuvant treatment plan.  REVIEW OF SYSTEMS:   Constitutional: Denies fevers, chills or abnormal weight loss Eyes: Denies blurriness of vision Ears, nose, mouth, throat, and face: Denies mucositis or sore throat Respiratory: Denies cough, dyspnea or wheezes Cardiovascular: Denies palpitation, chest discomfort Gastrointestinal:  Denies nausea, heartburn or change in bowel habits Skin: Denies abnormal skin rashes Lymphatics: Denies new lymphadenopathy or easy bruising Neurological:Denies numbness, tingling or new weaknesses Behavioral/Psych: Mood is stable, no new changes  Extremities: No lower extremity edema Breast: Recent bilateral mastectomies with reconstruction All other systems were reviewed with the patient and are negative.  I have reviewed the past medical history, past surgical history, social history and family history with the patient and they are unchanged from previous note.  ALLERGIES:  has No Known Allergies.  MEDICATIONS:  Current Outpatient Medications  Medication Sig Dispense Refill  . methocarbamol (ROBAXIN) 500 MG tablet Take 1 tablet (500 mg total) by mouth every 8 (eight) hours as needed for muscle spasms. 20 tablet 0  . oxyCODONE (OXY IR/ROXICODONE) 5 MG immediate release tablet Take 1 tablet (5 mg total) by mouth every 4 (four) hours as needed for moderate pain. 12  tablet 0   No current facility-administered medications for this visit.     PHYSICAL  EXAMINATION: ECOG PERFORMANCE STATUS: 1 - Symptomatic but completely ambulatory  Vitals:   08/02/17 0929  BP: 111/75  Pulse: 70  Resp: 18  Temp: 98.1 F (36.7 C)  SpO2: 100%   Filed Weights   08/02/17 0929  Weight: 203 lb 9.6 oz (92.4 kg)    GENERAL:alert, no distress and comfortable SKIN: skin color, texture, turgor are normal, no rashes or significant lesions EYES: normal, Conjunctiva are pink and non-injected, sclera clear OROPHARYNX:no exudate, no erythema and lips, buccal mucosa, and tongue normal  NECK: supple, thyroid normal size, non-tender, without nodularity LYMPH:  no palpable lymphadenopathy in the cervical, axillary or inguinal LUNGS: clear to auscultation and percussion with normal breathing effort HEART: regular rate & rhythm and no murmurs and no lower extremity edema ABDOMEN:abdomen soft, non-tender and normal bowel sounds MUSCULOSKELETAL:no cyanosis of digits and no clubbing  NEURO: alert & oriented x 3 with fluent speech, no focal motor/sensory deficits EXTREMITIES: No lower extremity edema  LABORATORY DATA:  I have reviewed the data as listed CMP Latest Ref Rng & Units 07/21/2017 06/02/2017 10/20/2015  Glucose 65 - 99 mg/dL 97 83 112(H)  BUN 6 - 20 mg/dL 9 10 9   Creatinine 0.44 - 1.00 mg/dL 0.75 0.73 0.40(L)  Sodium 135 - 145 mmol/L 138 139 140  Potassium 3.5 - 5.1 mmol/L 4.2 4.1 3.4(L)  Chloride 101 - 111 mmol/L 110 104 106  CO2 22 - 32 mmol/L 23 23 -  Calcium 8.9 - 10.3 mg/dL 8.8(L) 9.0 -  Total Protein 6.0 - 8.5 g/dL - 7.2 -  Total Bilirubin 0.0 - 1.2 mg/dL - <0.2 -  Alkaline Phos 39 - 117 IU/L - 42 -  AST 0 - 40 IU/L - 16 -  ALT 0 - 32 IU/L - 13 -    Lab Results  Component Value Date   WBC 7.0 07/21/2017   HGB 12.5 07/21/2017   HCT 40.4 07/21/2017   MCV 85.6 07/21/2017   PLT 231 07/21/2017   NEUTROABS 3.8 10/20/2015    ASSESSMENT & PLAN:  Malignant neoplasm of upper-outer quadrant of left breast in female, estrogen receptor positive  ([place]) 07/26/2017:Bilateral mastectomies: Left mastectomy: grade 2 Multifocal IDC with DCIS,1.8 cm and 1.7 cm, multifocal DCIS intermediate grade with necrosis which is a third focus, margins negative, 0/2 lymph nodes, ER 95%, PR 95%, Ki-67 25%, HER-2 negative ratio 1.1, T1CN0 stage IA (right mastectomy benign)  Pathology counseling: I discussed the final pathology report of the patient provided  a copy of this report. I discussed the margins as well as lymph node surgeries. We also discussed the final staging along with previously performed ER/PR and HER-2/neu testing.  Recommendation: 1.  Oncotype testing to determine if she would benefit from chemotherapy 2. Adjuvant antiestrogen therapy with tamoxifen.  Return to clinic based upon Oncotype test results. If she is low risk as determined by Oncotype test with a recurrence score less than 20, then we will start her on tamoxifen.  If the score is greater than 20 then we will offer her systemic chemotherapy.  Return to clinic based upon Oncotype testing No orders of the defined types were placed in this encounter.  The patient has a good understanding of the overall plan. she agrees with it. she will call with any problems that may develop before the next visit here.   Harriette Ohara, MD 08/02/17

## 2017-08-02 NOTE — Assessment & Plan Note (Signed)
07/26/2017:Bilateral mastectomies: Left mastectomy: grade 2 Multifocal IDC with DCIS,1.8 cm and 1.7 cm, multifocal DCIS intermediate grade with necrosis which is a third focus, margins negative, 0/2 lymph nodes, ER 95%, PR 95%, Ki-67 25%, HER-2 negative ratio 1.1, T1CN0 stage IA (right mastectomy benign)  Pathology counseling: I discussed the final pathology report of the patient provided  a copy of this report. I discussed the margins as well as lymph node surgeries. We also discussed the final staging along with previously performed ER/PR and HER-2/neu testing.  Recommendation: 1.  Oncotype testing to determine if she would benefit from chemotherapy 2. Adjuvant antiestrogen therapy with tamoxifen.  Return to clinic based upon Oncotype test results. If she is low risk as determined by Oncotype test with a recurrence score less than 20, then we will start her on tamoxifen.  If the score is greater than 20 then we will offer her systemic chemotherapy.

## 2017-08-11 ENCOUNTER — Telehealth: Payer: Self-pay | Admitting: *Deleted

## 2017-08-11 NOTE — Telephone Encounter (Signed)
Received oncotype score of 18/5%. Physician team notified. Left vm for pt to return call to discuss the results and next steps. Contact information provided

## 2017-08-12 ENCOUNTER — Telehealth: Payer: Self-pay | Admitting: Obstetrics and Gynecology

## 2017-08-12 ENCOUNTER — Other Ambulatory Visit: Payer: PRIVATE HEALTH INSURANCE

## 2017-08-12 ENCOUNTER — Other Ambulatory Visit: Payer: PRIVATE HEALTH INSURANCE | Admitting: Obstetrics and Gynecology

## 2017-08-12 ENCOUNTER — Telehealth: Payer: Self-pay | Admitting: *Deleted

## 2017-08-12 NOTE — Telephone Encounter (Signed)
Left detailed msg for pt to return call to discuss oncotype results of 18/5% and no chemo recommended. Request return call.

## 2017-08-12 NOTE — Telephone Encounter (Signed)
Spoke with patient in regards to re-scheduling missed ultrasound appointment today. Patient has re-scheduled 08/19/17 with Dr Quincy Simmonds. Patient aware of appointment date, arrival time and cancellation policy. Patient had no further questions.   Routing to Dr Quincy Simmonds for final review. Patient is agreeable to disposition. Will close encounter

## 2017-08-12 NOTE — Telephone Encounter (Signed)
Called to patient regarding missed appointment. Patient said she thought it was on a different day. Routed call to [facility] to reschedule.

## 2017-08-13 ENCOUNTER — Encounter (HOSPITAL_COMMUNITY): Payer: Self-pay | Admitting: Hematology and Oncology

## 2017-08-16 NOTE — Telephone Encounter (Signed)
Routing to nursing supervisor to be updated.  Patient also did not keep her appointment for thyroid ultrasound.

## 2017-08-18 ENCOUNTER — Telehealth: Payer: Self-pay

## 2017-08-18 NOTE — Telephone Encounter (Signed)
Spoke with patient who is ready to reschedule her thyroid U/S. Patient reports she is available any day or time. Advised will contact [place] for scheduling and return call.  Spoke with [facility]. Thyroid U/S scheduled for 08/25/2017 at 3:45 pm at [place].  Left message for patient to call [place] at [phone].

## 2017-08-19 ENCOUNTER — Other Ambulatory Visit: Payer: PRIVATE HEALTH INSURANCE

## 2017-08-19 ENCOUNTER — Other Ambulatory Visit: Payer: PRIVATE HEALTH INSURANCE | Admitting: Obstetrics and Gynecology

## 2017-08-19 ENCOUNTER — Telehealth: Payer: Self-pay | Admitting: Obstetrics and Gynecology

## 2017-08-19 NOTE — Telephone Encounter (Signed)
Left message on voicemail regarding missed appointment.  °

## 2017-08-20 ENCOUNTER — Telehealth: Payer: Self-pay | Admitting: *Deleted

## 2017-08-20 ENCOUNTER — Encounter: Payer: Self-pay | Admitting: *Deleted

## 2017-08-20 ENCOUNTER — Encounter: Payer: Self-pay | Admitting: Obstetrics and Gynecology

## 2017-08-20 NOTE — Telephone Encounter (Signed)
See staff message.  Encounter closed.

## 2017-08-20 NOTE — Telephone Encounter (Signed)
Spoke to pt regarding Oncotype score of 18 and that she does not need chemotherapy. Denies further needs at this time

## 2017-08-23 ENCOUNTER — Ambulatory Visit: Payer: PRIVATE HEALTH INSURANCE | Attending: General Surgery | Admitting: Physical Therapy

## 2017-08-25 ENCOUNTER — Inpatient Hospital Stay: Admission: RE | Admit: 2017-08-25 | Payer: PRIVATE HEALTH INSURANCE | Source: Ambulatory Visit

## 2017-09-01 ENCOUNTER — Encounter: Payer: Self-pay | Admitting: *Deleted

## 2017-09-03 ENCOUNTER — Inpatient Hospital Stay: Payer: PRIVATE HEALTH INSURANCE | Attending: Hematology and Oncology | Admitting: Hematology and Oncology

## 2017-09-03 ENCOUNTER — Encounter: Payer: Self-pay | Admitting: *Deleted

## 2017-09-03 DIAGNOSIS — Z79818 Long term (current) use of other agents affecting estrogen receptors and estrogen levels: Secondary | ICD-10-CM | POA: Insufficient documentation

## 2017-09-03 DIAGNOSIS — Z170 Estrogen receptor positive status [ER+]: Secondary | ICD-10-CM | POA: Diagnosis not present

## 2017-09-03 DIAGNOSIS — C50412 Malignant neoplasm of upper-outer quadrant of left female breast: Secondary | ICD-10-CM

## 2017-09-03 DIAGNOSIS — Z9013 Acquired absence of bilateral breasts and nipples: Secondary | ICD-10-CM | POA: Diagnosis not present

## 2017-09-03 NOTE — Progress Notes (Signed)
Patient Care Team: Patient, No Pcp Per as PCP - General (General Practice) Rolm Bookbinder, MD as Consulting Physician (General Surgery) Nicholas Lose, MD as Consulting Physician (Hematology and Oncology) Kyung Rudd, MD as Consulting Physician (Radiation Oncology)  DIAGNOSIS:  Encounter Diagnosis  Name Primary?  . Malignant neoplasm of upper-outer quadrant of left breast in female, estrogen receptor positive ([place])     SUMMARY OF ONCOLOGIC HISTORY:   Malignant neoplasm of upper-outer quadrant of left breast in female, estrogen receptor positive ([place])   06/15/2017 Initial Diagnosis    Palpable left breast lump with bloody nipple discharge for the past 5 years extremely dense breasts 9 cm area of calcifications ultrasound 2:30 position 1.9 cm and 2:45 position 1.5 cm biopsy-proven grade 1 IDC ER 95%, PR 95%, Ki-67 25%, HER-2 negative ratio 1.1, T1c N0 stage I a clinical stage      07/13/2017 Genetic Testing    The Common Hereditary Cancer Panel offered by [facility] includes sequencing and/or deletion duplication testing of the following 47 genes: APC, ATM, AXIN2, BARD1, BMPR1A, BRCA1, BRCA2, BRIP1, CDH1, CDKN2A (p14ARF), CDKN2A (p16INK4a), CKD4, CHEK2, CTNNA1, DICER1, EPCAM (Deletion/duplication testing only), GREM1 (promoter region deletion/duplication testing only), KIT, MEN1, MLH1, MSH2, MSH3, MSH6, MUTYH, NBN, NF1, NHTL1, PALB2, PDGFRA, PMS2, POLD1, POLE, PTEN, RAD50, RAD51C, RAD51D, SDHB, SDHC, SDHD, SMAD4, SMARCA4. STK11, TP53, TSC1, TSC2, and VHL.  The following genes were evaluated for sequence changes only: SDHA and HOXB13 c.251G>A variant only.  Results: Negative, no pathogenic variants identified.  The date of this test report is 07/13/2017.       07/26/2017 Surgery    Bilateral mastectomies: Left mastectomy: grade 2 Multifocal IDC with DCIS,1.8 cm and 1.7 cm, multifocal DCIS intermediate grade with necrosis which is a third focus, margins negative, 0/2 lymph nodes, ER 95%, PR 95%,  Ki-67 25%, HER-2 negative ratio 1.1, T1CN0 stage IA (right mastectomy benign)      08/04/2017 Oncotype testing    Oncotype DX recurrence score 18: Distant recurrence at 9 years with hormone therapy alone 5%      09/03/2017 -  Anti-estrogen oral therapy    Antiestrogen therapy with tamoxifen 20 mg daily x10 years       CHIEF COMPLIANT: Patient is here to discuss the role of tamoxifen therapy.  INTERVAL HISTORY: [place] Shenet Ahlgrim is a 26 year old with above-mentioned history of bilateral mastectomies for left breast cancer.  She had a low risk on the Oncotype score and is here today to discuss adjuvant treatment options.  She does not need radiation therapy.  She is here accompanied by her aunt.  REVIEW OF SYSTEMS:   Constitutional: Denies fevers, chills or abnormal weight loss Eyes: Denies blurriness of vision Ears, nose, mouth, throat, and face: Denies mucositis or sore throat Respiratory: Denies cough, dyspnea or wheezes Cardiovascular: Denies palpitation, chest discomfort Gastrointestinal:  Denies nausea, heartburn or change in bowel habits Skin: Denies abnormal skin rashes Lymphatics: Denies new lymphadenopathy or easy bruising Neurological:Denies numbness, tingling or new weaknesses Behavioral/Psych: Mood is stable, no new changes  Extremities: No lower extremity edema  All other systems were reviewed with the patient and are negative.  I have reviewed the past medical history, past surgical history, social history and family history with the patient and they are unchanged from previous note.  ALLERGIES:  has No Known Allergies.  MEDICATIONS:  Current Outpatient Medications  Medication Sig Dispense Refill  . methocarbamol (ROBAXIN) 500 MG tablet Take 1 tablet (500 mg total) by mouth every 8 (eight)  hours as needed for muscle spasms. 20 tablet 0  . oxyCODONE (OXY IR/ROXICODONE) 5 MG immediate release tablet Take 1 tablet (5 mg total) by mouth every 4 (four) hours as  needed for moderate pain. 12 tablet 0   No current facility-administered medications for this visit.     PHYSICAL EXAMINATION: ECOG PERFORMANCE STATUS: 0 - Asymptomatic  Vitals:   09/03/17 1044  BP: 130/77  Pulse: 63  Resp: 20  Temp: 98.6 F (37 C)  SpO2: 100%   Filed Weights   09/03/17 1044  Weight: 206 lb 3.2 oz (93.5 kg)    GENERAL:alert, no distress and comfortable SKIN: skin color, texture, turgor are normal, no rashes or significant lesions EYES: normal, Conjunctiva are pink and non-injected, sclera clear OROPHARYNX:no exudate, no erythema and lips, buccal mucosa, and tongue normal  NECK: supple, thyroid normal size, non-tender, without nodularity LYMPH:  no palpable lymphadenopathy in the cervical, axillary or inguinal LUNGS: clear to auscultation and percussion with normal breathing effort HEART: regular rate & rhythm and no murmurs and no lower extremity edema ABDOMEN:abdomen soft, non-tender and normal bowel sounds MUSCULOSKELETAL:no cyanosis of digits and no clubbing  NEURO: alert & oriented x 3 with fluent speech, no focal motor/sensory deficits EXTREMITIES: No lower extremity edema   LABORATORY DATA:  I have reviewed the data as listed CMP Latest Ref Rng & Units 07/21/2017 06/02/2017 10/20/2015  Glucose 65 - 99 mg/dL 97 83 112(H)  BUN 6 - 20 mg/dL 9 10 9   Creatinine 0.44 - 1.00 mg/dL 0.75 0.73 0.40(L)  Sodium 135 - 145 mmol/L 138 139 140  Potassium 3.5 - 5.1 mmol/L 4.2 4.1 3.4(L)  Chloride 101 - 111 mmol/L 110 104 106  CO2 22 - 32 mmol/L 23 23 -  Calcium 8.9 - 10.3 mg/dL 8.8(L) 9.0 -  Total Protein 6.0 - 8.5 g/dL - 7.2 -  Total Bilirubin 0.0 - 1.2 mg/dL - <0.2 -  Alkaline Phos 39 - 117 IU/L - 42 -  AST 0 - 40 IU/L - 16 -  ALT 0 - 32 IU/L - 13 -    Lab Results  Component Value Date   WBC 7.0 07/21/2017   HGB 12.5 07/21/2017   HCT 40.4 07/21/2017   MCV 85.6 07/21/2017   PLT 231 07/21/2017   NEUTROABS 3.8 10/20/2015    ASSESSMENT & PLAN:    Malignant neoplasm of upper-outer quadrant of left breast in female, estrogen receptor positive ([place]) 07/26/2017:Bilateral mastectomies: Left mastectomy: grade 2 Multifocal IDC with DCIS,1.8 cm and 1.7 cm, multifocal DCIS intermediate grade with necrosis which is a third focus, margins negative, 0/2 lymph nodes, ER 95%, PR 95%, Ki-67 25%, HER-2 negative ratio 1.1, T1CN0 stage IA (right mastectomy benign)  Oncotype DX recurrence score 18: Risk of distant recurrence at 9 years 5%  Treatment plan: Antiestrogen therapy with tamoxifen 20 mg daily x10 years  We discussed the risks and benefits of tamoxifen. These include but not limited to insomnia, hot flashes, mood changes, vaginal dryness, and weight gain. Although rare, serious side effects including endometrial cancer, risk of blood clots were also discussed. We strongly believe that the benefits far outweigh the risks. Patient understands these risks and consented to starting treatment. Planned treatment duration is 10 years.  Return to clinic in 3 months for survivorship care plan visit and toxicity evaluation on tamoxifen  No orders of the defined types were placed in this encounter.  The patient has a good understanding of the overall plan. she agrees with  it. she will call with any problems that may develop before the next visit here.   Harriette Ohara, MD 09/03/17

## 2017-09-03 NOTE — Assessment & Plan Note (Signed)
07/26/2017:Bilateral mastectomies: Left mastectomy: grade 2 Multifocal IDC with DCIS,1.8 cm and 1.7 cm, multifocal DCIS intermediate grade with necrosis which is a third focus, margins negative, 0/2 lymph nodes, ER 95%, PR 95%, Ki-67 25%, HER-2 negative ratio 1.1, T1CN0 stage IA (right mastectomy benign)  Oncotype DX recurrence score 18: Risk of distant recurrence at 9 years 5%  Treatment plan: Antiestrogen therapy with tamoxifen 20 mg daily x10 years  We discussed the risks and benefits of tamoxifen. These include but not limited to insomnia, hot flashes, mood changes, vaginal dryness, and weight gain. Although rare, serious side effects including endometrial cancer, risk of blood clots were also discussed. We strongly believe that the benefits far outweigh the risks. Patient understands these risks and consented to starting treatment. Planned treatment duration is 10 years.

## 2017-09-06 NOTE — Progress Notes (Signed)
Received call from benefits services from, [facility] requesting progress notes for pt beginning 08/02/17 - present. Faxed information to [phone]. Phone [phone]

## 2017-11-24 ENCOUNTER — Telehealth: Payer: Self-pay

## 2017-11-24 NOTE — Telephone Encounter (Signed)
Spoke with pt reminding of SCP visit with NP on 12/02/17 at 2 pm.  Pt says she will come to appt.

## 2017-12-02 ENCOUNTER — Encounter: Payer: Self-pay | Admitting: Adult Health

## 2017-12-02 ENCOUNTER — Inpatient Hospital Stay: Payer: PRIVATE HEALTH INSURANCE | Attending: Hematology and Oncology | Admitting: Adult Health

## 2017-12-02 ENCOUNTER — Telehealth: Payer: Self-pay | Admitting: Hematology and Oncology

## 2017-12-02 ENCOUNTER — Encounter: Payer: Self-pay | Admitting: *Deleted

## 2017-12-02 VITALS — BP 115/81 | HR 92 | Temp 98.2°F | Resp 18 | Ht 68.0 in | Wt 195.2 lb

## 2017-12-02 DIAGNOSIS — C50412 Malignant neoplasm of upper-outer quadrant of left female breast: Secondary | ICD-10-CM | POA: Insufficient documentation

## 2017-12-02 DIAGNOSIS — Z803 Family history of malignant neoplasm of breast: Secondary | ICD-10-CM | POA: Insufficient documentation

## 2017-12-02 DIAGNOSIS — F1210 Cannabis abuse, uncomplicated: Secondary | ICD-10-CM | POA: Diagnosis not present

## 2017-12-02 DIAGNOSIS — Z87891 Personal history of nicotine dependence: Secondary | ICD-10-CM | POA: Diagnosis not present

## 2017-12-02 DIAGNOSIS — Z9013 Acquired absence of bilateral breasts and nipples: Secondary | ICD-10-CM

## 2017-12-02 DIAGNOSIS — Z170 Estrogen receptor positive status [ER+]: Secondary | ICD-10-CM | POA: Insufficient documentation

## 2017-12-02 NOTE — Progress Notes (Signed)
CLINIC:  Survivorship   REASON FOR VISIT:  Routine follow-up post-treatment for a recent history of breast cancer.  BRIEF ONCOLOGIC HISTORY:    Malignant neoplasm of upper-outer quadrant of left breast in female, estrogen receptor positive ([place])   06/15/2017 Initial Diagnosis    Palpable left breast lump with bloody nipple discharge for the past 5 years extremely dense breasts 9 cm area of calcifications ultrasound 2:30 position 1.9 cm and 2:45 position 1.5 cm biopsy-proven grade 1 IDC ER 95%, PR 95%, Ki-67 25%, HER-2 negative ratio 1.1, T1c N0 stage I a clinical stage    07/13/2017 Genetic Testing    The Common Hereditary Cancer Panel offered by [facility] includes sequencing and/or deletion duplication testing of the following 47 genes: APC, ATM, AXIN2, BARD1, BMPR1A, BRCA1, BRCA2, BRIP1, CDH1, CDKN2A (p14ARF), CDKN2A (p16INK4a), CKD4, CHEK2, CTNNA1, DICER1, EPCAM (Deletion/duplication testing only), GREM1 (promoter region deletion/duplication testing only), KIT, MEN1, MLH1, MSH2, MSH3, MSH6, MUTYH, NBN, NF1, NHTL1, PALB2, PDGFRA, PMS2, POLD1, POLE, PTEN, RAD50, RAD51C, RAD51D, SDHB, SDHC, SDHD, SMAD4, SMARCA4. STK11, TP53, TSC1, TSC2, and VHL.  The following genes were evaluated for sequence changes only: SDHA and HOXB13 c.251G>A variant only.  Results: Negative, no pathogenic variants identified.  The date of this test report is 07/13/2017.     07/26/2017 Surgery    Bilateral mastectomies: Left mastectomy: grade 2 Multifocal IDC with DCIS,1.8 cm and 1.7 cm, multifocal DCIS intermediate grade with necrosis which is a third focus, margins negative, 0/2 lymph nodes, ER 95%, PR 95%, Ki-67 25%, HER-2 negative ratio 1.1, T1CN0 stage IA (right mastectomy benign)    08/04/2017 Oncotype testing    Oncotype DX recurrence score 18: Distant recurrence at 9 years with hormone therapy alone 5%    09/03/2017 -  Anti-estrogen oral therapy    Antiestrogen therapy with tamoxifen 20 mg daily x10 years    12/02/2017 Cancer Staging    Staging form: Breast, AJCC 8th Edition - Pathologic: Stage IA (pT1c, pN0, cM0, G2, ER+, PR+, HER2-, Oncotype DX score: 18) - Signed by Gardenia Phlegm, NP on 12/02/2017     INTERVAL HISTORY:  [name] presents to the [facility] today for our initial meeting to review her survivorship care plan detailing her treatment course for breast cancer, as well as monitoring long-term side effects of that treatment, education regarding health maintenance, screening, and overall wellness and health promotion.     Overall, [name] reports feeling quite well.  She has decided not to take Tamoxifen because she doesn't want to.     REVIEW OF SYSTEMS:  Review of Systems  Constitutional: Negative for appetite change, chills, fatigue, fever and unexpected weight change.  HENT:   Negative for hearing loss and lump/mass.   Eyes: Negative for eye problems and icterus.  Respiratory: Negative for chest tightness, cough and shortness of breath.   Cardiovascular: Negative for chest pain, leg swelling and palpitations.  Gastrointestinal: Negative for abdominal distention, abdominal pain, constipation, diarrhea, nausea and vomiting.  Endocrine: Negative for hot flashes.  Skin: Negative for itching and rash.  Neurological: Negative for dizziness, extremity weakness and numbness.  Hematological: Negative for adenopathy. Does not bruise/bleed easily.  Psychiatric/Behavioral: Negative for depression. The patient is not nervous/anxious.   Breast: Denies any new nodularity, masses, tenderness, nipple changes, or nipple discharge.      ONCOLOGY TREATMENT TEAM:  1. Surgeon:  Dr. Donne Hazel at [facility] 2. Medical Oncologist: Dr. Lindi Adie      PAST MEDICAL/SURGICAL HISTORY:  Past  Medical History:  Diagnosis Date  . Anxiety   . Cancer of left breast ([place])   . Elevated prolactin level ([place]) 2014   MRI of pituitary 08/30/12 - [place] - no pituitary or  suprasellar mass  . Family history of breast cancer   . Menorrhagia with irregular cycle 2019   pelvic ultrasound recommended and not completed as of 07/13/17.  Marland Kitchen Thyroid nodule 2014   biopsy recommended in [place] and not done.  has follow up thyroid ultrasound 07/22/17 in [place].   Past Surgical History:  Procedure Laterality Date  . BREAST BIOPSY Left 05/2017  . MANDIBLE SURGERY    . MASTECTOMY Right 07/26/2017  . MASTECTOMY COMPLETE / SIMPLE W/ SENTINEL NODE BIOPSY Left 07/26/2017  . MASTECTOMY W/ SENTINEL NODE BIOPSY Bilateral 07/26/2017   Procedure: BILATERAL TOTAL MASTECTOMIES WITH LEFT SENTINEL LYMPH NODE BIOPSY;  Surgeon: Rolm Bookbinder, MD;  Location: [place];  Service: General;  Laterality: Bilateral;  BILATERAL PEC BLOCKS     ALLERGIES:  No Known Allergies   CURRENT MEDICATIONS:  Outpatient Encounter Medications as of 12/02/2017  Medication Sig  . [DISCONTINUED] methocarbamol (ROBAXIN) 500 MG tablet Take 1 tablet (500 mg total) by mouth every 8 (eight) hours as needed for muscle spasms. (Patient not taking: Reported on 12/02/2017)  . [DISCONTINUED] oxyCODONE (OXY IR/ROXICODONE) 5 MG immediate release tablet Take 1 tablet (5 mg total) by mouth every 4 (four) hours as needed for moderate pain. (Patient not taking: Reported on 12/02/2017)   No facility-administered encounter medications on file as of 12/02/2017.      ONCOLOGIC FAMILY HISTORY:  Family History  Problem Relation Age of Onset  . Heart attack Father   . Breast cancer Maternal Aunt   . Breast cancer Maternal Grandmother        died at 18, dx 17's     GENETIC COUNSELING/TESTING: See above  SOCIAL HISTORY:  Social History   Socioeconomic History  . Marital status: Single    Spouse name: Not on file  . Number of children: Not on file  . Years of education: Not on file  . Highest education level: Not on file  Occupational History  . Not on file  Social Needs  . Financial resource strain: Not  on file  . Food insecurity:    Worry: Not on file    Inability: Not on file  . Transportation needs:    Medical: Not on file    Non-medical: Not on file  Tobacco Use  . Smoking status: Former Smoker    Packs/day: 1.00    Years: 12.00    Pack years: 12.00    Types: Cigarettes    Last attempt to quit: 05/04/2017    Years since quitting: 0.5  . Smokeless tobacco: Never Used  Substance and Sexual Activity  . Alcohol use: Yes    Alcohol/week: 3.0 standard drinks    Types: 1 Glasses of wine, 1 Cans of beer, 1 Shots of liquor per week  . Drug use: Yes    Types: Marijuana    Comment: 07/26/2017 "daily"  . Sexual activity: Yes    Comment: Female partner  Lifestyle  . Physical activity:    Days per week: Not on file    Minutes per session: Not on file  . Stress: Not on file  Relationships  . Social connections:    Talks on phone: Not on file    Gets together: Not on file    Attends religious service: Not on file  Active member of club or organization: Not on file    Attends meetings of clubs or organizations: Not on file    Relationship status: Not on file  . Intimate partner violence:    Fear of current or ex partner: Not on file    Emotionally abused: Not on file    Physically abused: Not on file    Forced sexual activity: Not on file  Other Topics Concern  . Not on file  Social History Narrative  . Not on file      PHYSICAL EXAMINATION:  Vital Signs:   Vitals:   12/02/17 1440  BP: 115/81  Pulse: 92  Resp: 18  Temp: 98.2 F (36.8 C)  SpO2: 100%   Filed Weights   12/02/17 1440  Weight: 195 lb 3.2 oz (88.5 kg)   General: Well-nourished, well-appearing female in no acute distress.  She is unaccompanied today.   HEENT: Head is normocephalic.  Pupils equal and reactive to light. Conjunctivae clear without exudate.  Sclerae anicteric. Oral mucosa is pink, moist.  Oropharynx is pink without lesions or erythema.  Lymph: No cervical, supraclavicular, or  infraclavicular lymphadenopathy noted on palpation.  Cardiovascular: Regular rate and rhythm.Marland Kitchen Respiratory: Clear to auscultation bilaterally. Chest expansion symmetric; breathing non-labored. Breasts: s/p bilateral mastectomies, keloid scarring noted, no nodules noted, no sign of recurrence  GI: Abdomen soft and round; non-tender, non-distended. Bowel sounds normoactive.  GU: Deferred.  Neuro: No focal deficits. Steady gait.  Psych: Mood and affect normal and appropriate for situation.  Extremities: No edema. MSK: No focal spinal tenderness to palpation.  Full range of motion in bilateral upper extremities Skin: Warm and dry.  LABORATORY DATA:  None for this visit.  DIAGNOSTIC IMAGING:  None for this visit.      ASSESSMENT AND PLAN:  Ms.. Caroline Matthews is a pleasant 27 y.o. female with Stage IA left breast invasive ductal carcinoma, ER+/PR+/HER2-, diagnosed in 05/2017, treated with bilateral mastectomies and anti-estrogen therapy with Tamoxifen beginning in 08/2017.  She presents to the Survivorship Clinic for our initial meeting and routine follow-up post-completion of treatment for breast cancer.    1. Stage IA left breast cancer:  Ms. Angell is continuing to recover from definitive treatment for breast cancer. She will follow-up with her medical oncologist, Dr. Lindi Adie in 6 months with history and physical exam per surveillance protocol.  She has not started on Tamoxifen.  She is aware and understands that this can increase her breast cancer risk down the road.  She declined to discuss it further today.    Today, a comprehensive survivorship care plan and treatment summary was reviewed with the patient today detailing her breast cancer diagnosis, treatment course, potential late/long-term effects of treatment, appropriate follow-up care with recommendations for the future, and patient education resources.  A copy of this summary, along with a letter will be sent to the patient's primary care  provider via mail/fax/In Basket message after today's visit.    2. Cancer screening:  Due to Ms. Kanan's history and her age, she should receive screening for skin cancers, and gynecologic cancers.  The information and recommendations are listed on the patient's comprehensive care plan/treatment summary and were reviewed in detail with the patient.    3. Health maintenance and wellness promotion: Ms. Dokken was encouraged to consume 5-7 servings of fruits and vegetables per day. We reviewed the "Nutrition Rainbow" handout, as well as the handout "Take Control of Your Health and Reduce Your Cancer Risk" from the American Cancer  Society.  She was also encouraged to engage in moderate to vigorous exercise for 30 minutes per day most days of the week. We discussed the [employer] fitness program, which is designed for cancer survivors to help them become more physically fit after cancer treatments.  She was instructed to limit her alcohol consumption and continue to abstain from tobacco use.     4. Support services/counseling: It is not uncommon for this period of the patient's cancer care trajectory to be one of many emotions and stressors.  We discussed an opportunity for her to participate in the next session of [facility] ("Finding Your New Normal") support group series designed for patients after they have completed treatment.   Ms. Detore was encouraged to take advantage of our many other support services programs, support groups, and/or counseling in coping with her new life as a cancer survivor after completing anti-cancer treatment.  She was offered support today through active listening and expressive supportive counseling.  She was given information regarding our available services and encouraged to contact me with any questions or for help enrolling in any of our support group/programs.    Dispo:   -Return to cancer center in 6 months for f/u with Dr. Lindi Adie  -She is welcome to return back to the  Survivorship Clinic at any time; no additional follow-up needed at this time.  -Consider referral back to survivorship as a long-term survivor for continued surveillance  A total of (30) minutes of face-to-face time was spent with this patient with greater than 50% of that time in counseling and care-coordination.   Gardenia Phlegm, NP Survivorship Program [place] [phone]   Note: PRIMARY CARE PROVIDER Patient, No Pcp Per None None

## 2017-12-02 NOTE — Telephone Encounter (Signed)
Gave pt avs and calendar  °

## 2018-05-25 NOTE — Progress Notes (Signed)
Patient did not answer her phone calls for the next visit This encounter was created in error - please disregard.

## 2018-05-26 ENCOUNTER — Inpatient Hospital Stay: Payer: PRIVATE HEALTH INSURANCE | Attending: Hematology and Oncology | Admitting: Hematology and Oncology

## 2018-05-26 DIAGNOSIS — Z170 Estrogen receptor positive status [ER+]: Secondary | ICD-10-CM

## 2018-05-26 DIAGNOSIS — C50412 Malignant neoplasm of upper-outer quadrant of left female breast: Secondary | ICD-10-CM

## 2018-05-26 NOTE — Assessment & Plan Note (Signed)
07/26/2017:Bilateral mastectomies: Left mastectomy: grade 2 Multifocal IDC with DCIS,1.8 cm and 1.7 cm, multifocal DCIS intermediate grade with necrosis which is a third focus, margins negative, 0/2 lymph nodes, ER 95%, PR 95%, Ki-67 25%, HER-2 negative ratio 1.1, T1CN0 stage IA (right mastectomy benign)  Oncotype DX recurrence score 18: Risk of distant recurrence at 9 years 5%  Treatment plan: Antiestrogen therapy with tamoxifen 20 mg daily x10 years Tamoxifen toxicities:  Breast cancer surveillance: No role of imaging because she had bilateral mastectomies.  Return to clinic in 1 year for follow-up

## 2018-07-05 ENCOUNTER — Other Ambulatory Visit: Payer: Self-pay | Admitting: Hematology and Oncology

## 2019-02-06 IMAGING — DX DG CHEST 2V
2 series · 2 of 2 positions shown · non-contrast
Comparison: 03/15/2015

CLINICAL DATA: Cough x4 days.

EXAM:
CHEST - 2 VIEW

[chest pa]
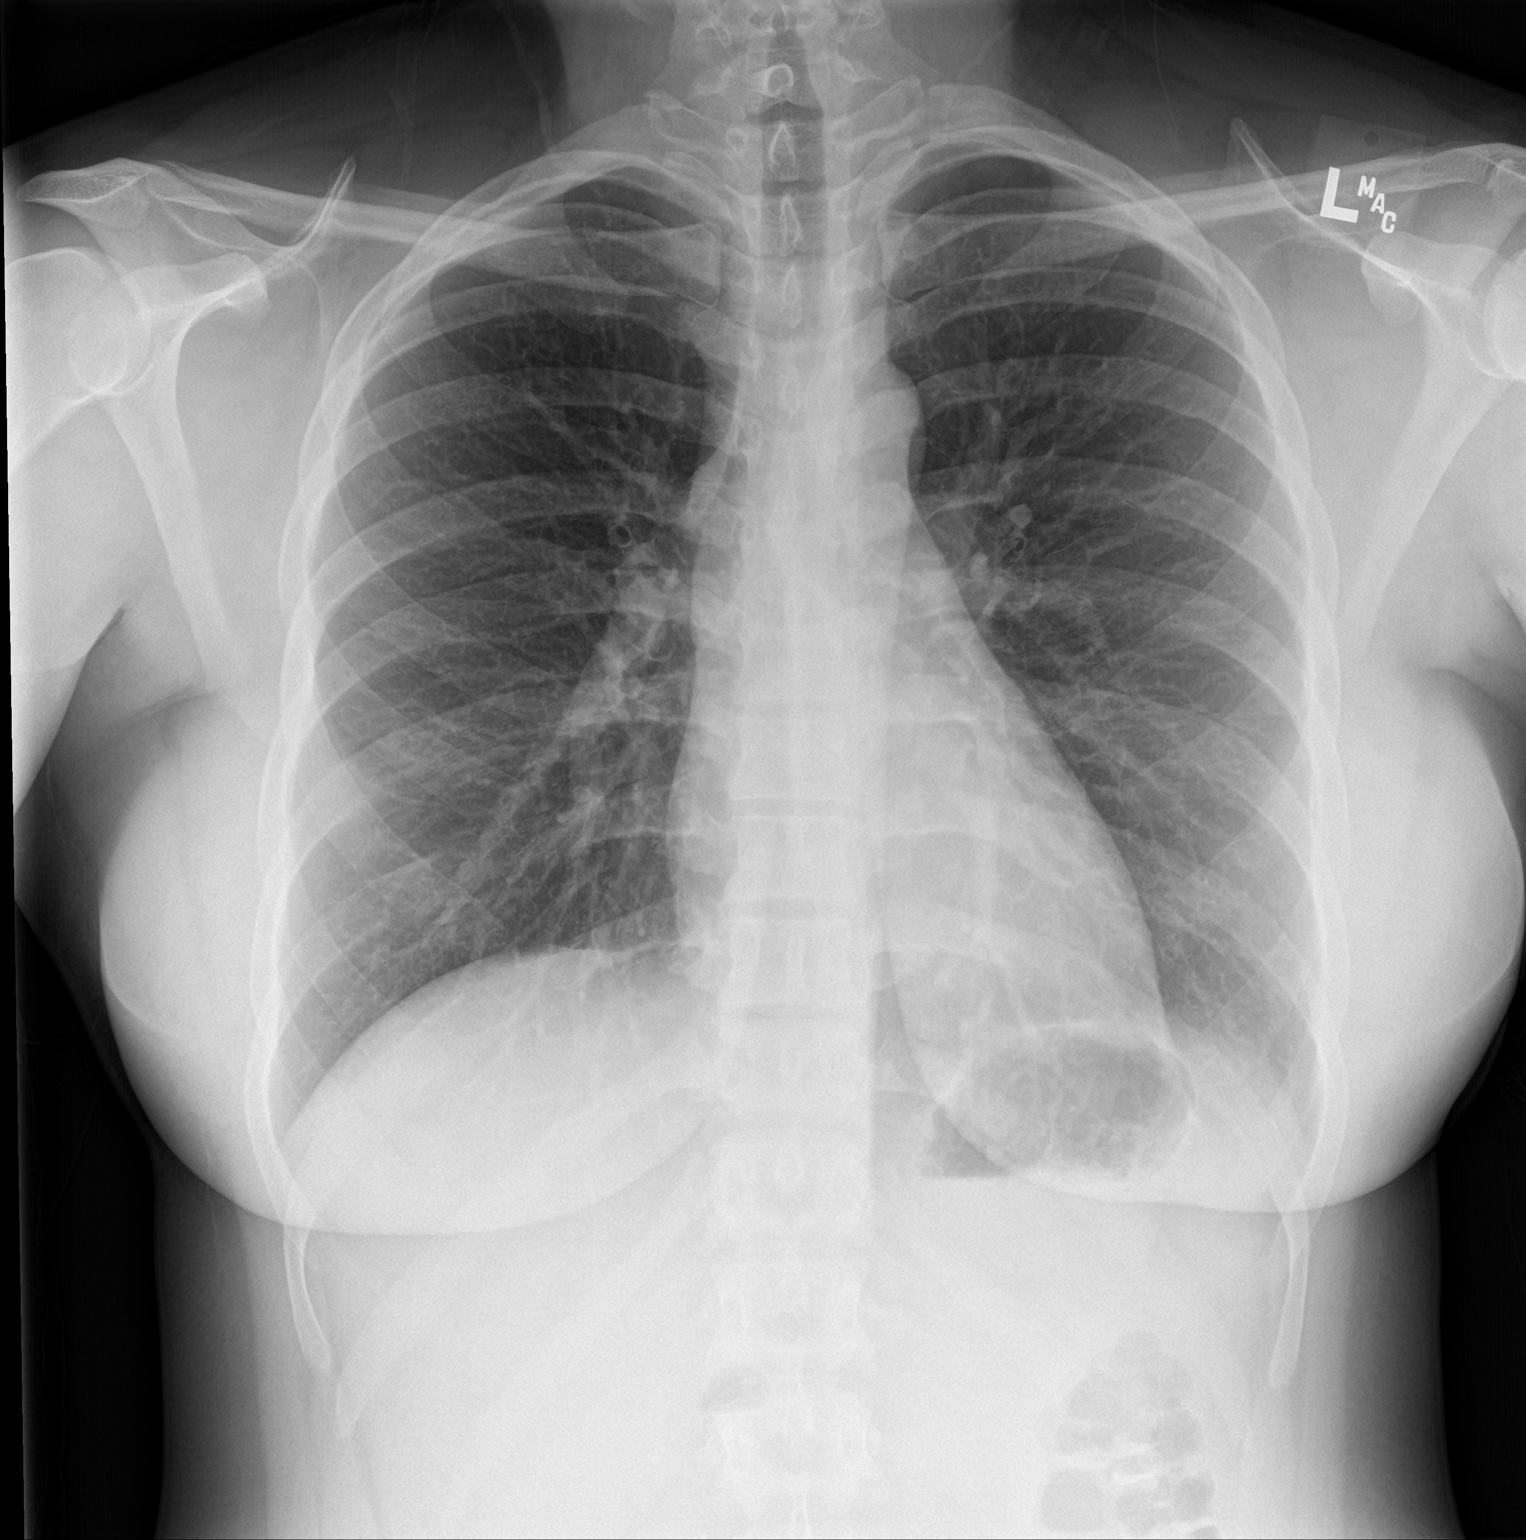

[chest lat]
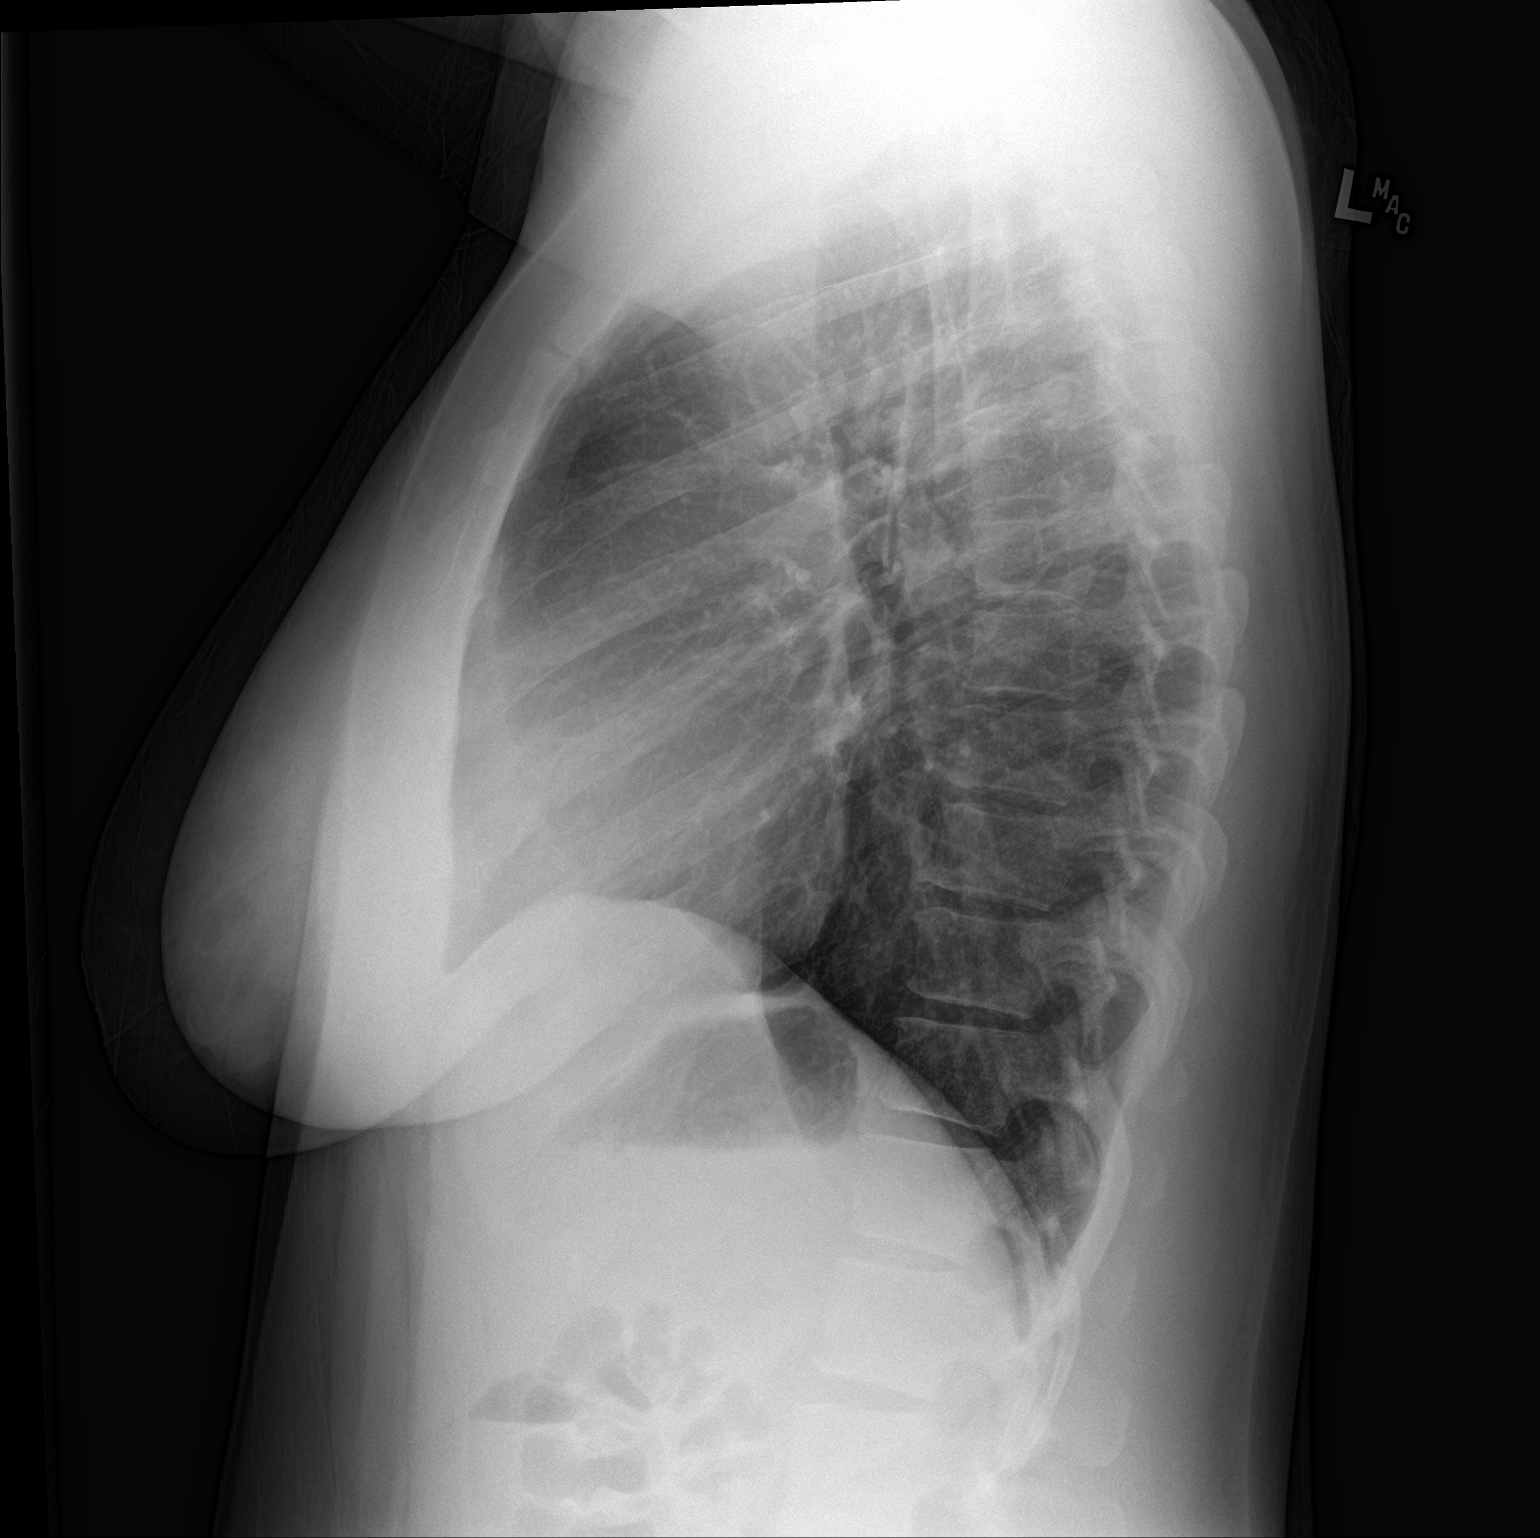

[2 of 2 positions shown; findings below may reference images not displayed]

FINDINGS: The heart size and mediastinal contours are within normal limits.
Minimal left basilar atelectasis. No overt pulmonary edema nor
effusion. No pneumothorax.. The visualized skeletal structures are
unremarkable.
IMPRESSION: No active cardiopulmonary disease.

## 2020-06-03 ENCOUNTER — Telehealth: Payer: Self-pay | Admitting: Hematology and Oncology

## 2020-06-03 NOTE — Telephone Encounter (Signed)
Scheduled per 4/6 staff msg. Called and spoke with pt confirmed 5/10 appt

## 2020-07-01 NOTE — Progress Notes (Signed)
Patient Care Team: Patient, No Pcp Per (Inactive) as PCP - General (General Practice) Rolm Bookbinder, MD as Consulting Physician (General Surgery) Nicholas Lose, MD as Consulting Physician (Hematology and Oncology) Kyung Rudd, MD as Consulting Physician (Radiation Oncology) Gardenia Phlegm, NP as Nurse Practitioner (Hematology and Oncology)  DIAGNOSIS:    ICD-10-CM   1. Malignant neoplasm of upper-outer quadrant of left breast in female, estrogen receptor positive ([place])  C50.412 US BREAST LTD UNI RIGHT INC AXILLA   Z17.0     SUMMARY OF ONCOLOGIC HISTORY: Oncology History  Malignant neoplasm of upper-outer quadrant of left breast in female, estrogen receptor positive (Caroline Matthews)  06/15/2017 Initial Diagnosis   Palpable left breast lump with bloody nipple discharge for the past 5 years extremely dense breasts 9 cm area of calcifications ultrasound 2:30 position 1.9 cm and 2:45 position 1.5 cm biopsy-proven grade 1 IDC ER 95%, PR 95%, Ki-67 25%, HER-2 negative ratio 1.1, T1c N0 stage I a clinical stage   07/13/2017 Genetic Testing   The Common Hereditary Cancer Panel offered by [facility] includes sequencing and/or deletion duplication testing of the following 47 genes: APC, ATM, AXIN2, BARD1, BMPR1A, BRCA1, BRCA2, BRIP1, CDH1, CDKN2A (p14ARF), CDKN2A (p16INK4a), CKD4, CHEK2, CTNNA1, DICER1, EPCAM (Deletion/duplication testing only), GREM1 (promoter region deletion/duplication testing only), KIT, MEN1, MLH1, MSH2, MSH3, MSH6, MUTYH, NBN, NF1, NHTL1, PALB2, PDGFRA, PMS2, POLD1, POLE, PTEN, RAD50, RAD51C, RAD51D, SDHB, SDHC, SDHD, SMAD4, SMARCA4. STK11, TP53, TSC1, TSC2, and VHL.  The following genes were evaluated for sequence changes only: SDHA and HOXB13 c.251G>A variant only.  Results: Negative, no pathogenic variants identified.  The date of this test report is 07/13/2017.    07/26/2017 Surgery   Bilateral mastectomies: Left mastectomy: grade 2 Multifocal IDC with DCIS,1.8 cm and 1.7 cm,  multifocal DCIS intermediate grade with necrosis which is a third focus, margins negative, 0/2 lymph nodes, ER 95%, PR 95%, Ki-67 25%, HER-2 negative ratio 1.1, T1CN0 stage IA (right mastectomy benign)   08/04/2017 Oncotype testing   Oncotype DX recurrence score 18: Distant recurrence at 9 years with hormone therapy alone 5%   09/03/2017 -  Anti-estrogen oral therapy   Decided not to take it   12/02/2017 Cancer Staging   Staging form: Breast, AJCC 8th Edition - Pathologic: Stage IA (pT1c, pN0, cM0, G2, ER+, PR+, HER2-, Oncotype DX score: 18) - Signed by Gardenia Phlegm, NP on 12/02/2017     CHIEF COMPLIANT: Follow-up of left breast cancer   INTERVAL HISTORY: Caroline Matthews is a 30 y.o. with above-mentioned history of left breast cancer treated with bilateral mastectomies and is currently on surveillance. She presents to the clinic today for follow-up.  We offered her antiestrogen therapy with tamoxifen but she refused.  She complains of sensitivity in bilateral chest wall scars.  Denies any pain or discomfort.  There is a palpable nodularity in the right chest wall scar.  ALLERGIES:  has No Known Allergies.  MEDICATIONS:  No current outpatient medications on file.   No current facility-administered medications for this visit.    PHYSICAL EXAMINATION: ECOG PERFORMANCE STATUS: 1 - Symptomatic but completely ambulatory  Vitals:   07/02/20 1455  BP: 121/75  Pulse: 81  Resp: 18  Temp: 98.8 F (37.1 C)  SpO2: 100%   Filed Weights   07/02/20 1455  Weight: 228 lb 12.8 oz (103.8 kg)    BREAST: Bilateral mastectomy scars are palpated there is slight palpable nodularity in the right chest wall scar.. (exam performed in the presence  of a chaperone)  LABORATORY DATA:  I have reviewed the data as listed CMP Latest Ref Rng & Units 07/21/2017 06/02/2017 10/20/2015  Glucose 65 - 99 mg/dL 97 83 112(H)  BUN 6 - 20 mg/dL _0 Creatinine 0.44 - 1.00 mg/dL 0.75 0.73 0.40(L)   Sodium 135 - 145 mmol/L 138 139 140  Potassium 3.5 - 5.1 mmol/L 4.2 4.1 3.4(L)  Chloride 101 - 111 mmol/L 110 104 106  CO2 22 - 32 mmol/L 23 23 -  Calcium 8.9 - 10.3 mg/dL 8.8(L) 9.0 -  Total Protein 6.0 - 8.5 g/dL - 7.2 -  Total Bilirubin 0.0 - 1.2 mg/dL - <0.2 -  Alkaline Phos 39 - 117 IU/L - 42 -  AST 0 - 40 IU/L - 16 -  ALT 0 - 32 IU/L - 13 -    Lab Results  Component Value Date   WBC 7.0 07/21/2017   HGB 12.5 07/21/2017   HCT 40.4 07/21/2017   MCV 85.6 07/21/2017   PLT 231 07/21/2017   NEUTROABS 3.8 10/20/2015    ASSESSMENT & PLAN:  Malignant neoplasm of upper-outer quadrant of left breast in female, estrogen receptor positive ([place]) 07/26/2017:Bilateral mastectomies: Left mastectomy: grade 2 Multifocal IDC with DCIS,1.8 cm and 1.7 cm, multifocal DCIS intermediate grade with necrosis which is a third focus, margins negative, 0/2 lymph nodes, ER 95%, PR 95%, Ki-67 25%, HER-2 negative ratio 1.1, T1CN0 stage IA (right mastectomy benign)  Oncotype DX recurrence score 18: Risk of distant recurrence at 9 years 5%  Treatment plan: I recommended tamoxifen but she decided not to take it.  Breast cancer surveillance: 1.  Breast exam 07/02/2020: Benign, slight palpable nodularity along the right surgical scar.  We will obtain ultrasound for further evaluation. 2. no role of imaging because she had bilateral mastectomies  Return to clinic in 1 year for follow-up    Orders Placed This Encounter  Procedures  . US BREAST LTD UNI RIGHT INC AXILLA    Standing Status:   Future    Standing Expiration Date:   07/02/2021    Order Specific Question:   Reason for Exam (SYMPTOM  OR DIAGNOSIS REQUIRED)    Answer:   S/P mastectomy, nodularity on scar tissue    Order Specific Question:   Preferred imaging location?    Answer:   [facility]    Order Specific Question:   Release to patient    Answer:   Immediate   The patient has a good understanding of the overall plan. she agrees with  it. she will call with any problems that may develop before the next visit here.  Total time spent: 20 mins including face to face time and time spent for planning, charting and coordination of care  Rulon Eisenmenger, MD, MPH 07/02/2020  I, Molly Dorshimer, am acting as scribe for Dr. Nicholas Lose.  I have reviewed the above documentation for accuracy and completeness, and I agree with the above.

## 2020-07-02 ENCOUNTER — Other Ambulatory Visit: Payer: Self-pay

## 2020-07-02 ENCOUNTER — Inpatient Hospital Stay: Payer: BLUE CROSS/BLUE SHIELD | Attending: Hematology and Oncology | Admitting: Hematology and Oncology

## 2020-07-02 DIAGNOSIS — Z9013 Acquired absence of bilateral breasts and nipples: Secondary | ICD-10-CM | POA: Insufficient documentation

## 2020-07-02 DIAGNOSIS — C50412 Malignant neoplasm of upper-outer quadrant of left female breast: Secondary | ICD-10-CM

## 2020-07-02 DIAGNOSIS — Z853 Personal history of malignant neoplasm of breast: Secondary | ICD-10-CM | POA: Insufficient documentation

## 2020-07-02 DIAGNOSIS — Z170 Estrogen receptor positive status [ER+]: Secondary | ICD-10-CM | POA: Insufficient documentation

## 2020-07-02 NOTE — Assessment & Plan Note (Signed)
07/26/2017:Bilateral mastectomies: Left mastectomy: grade 2 Multifocal IDC with DCIS,1.8 cm and 1.7 cm, multifocal DCIS intermediate grade with necrosis which is a third focus, margins negative, 0/2 lymph nodes, ER 95%, PR 95%, Ki-67 25%, HER-2 negative ratio 1.1, T1CN0 stage IA (right mastectomy benign)  Oncotype DX recurrence score 18: Risk of distant recurrence at 9 years 5%  Treatment plan: Antiestrogen therapy with tamoxifen 20 mg daily x10 years Tamoxifen toxicities:  Breast cancer surveillance: 1.  Breast exam 07/02/2020: Benign 2. no role of imaging because she had bilateral mastectomies  Return to clinic in 1 year for follow-up

## 2020-07-23 ENCOUNTER — Other Ambulatory Visit: Payer: Self-pay

## 2020-07-23 ENCOUNTER — Ambulatory Visit
Admission: RE | Admit: 2020-07-23 | Discharge: 2020-07-23 | Disposition: A | Discharge status: Home / Self Care | Payer: PRIVATE HEALTH INSURANCE | Source: Ambulatory Visit | Attending: Hematology and Oncology | Admitting: Hematology and Oncology

## 2020-07-23 DIAGNOSIS — C50412 Malignant neoplasm of upper-outer quadrant of left female breast: Secondary | ICD-10-CM

## 2021-06-18 ENCOUNTER — Telehealth: Payer: Self-pay | Admitting: Hematology and Oncology

## 2021-06-18 NOTE — Progress Notes (Incomplete)
? ?Patient Care Team: ?Patient, No Pcp Per (Inactive) as PCP - General (General Practice) ?Rolm Bookbinder, MD as Consulting Physician (General Surgery) ?Nicholas Lose, MD as Consulting Physician (Hematology and Oncology) ?Kyung Rudd, MD as Consulting Physician (Radiation Oncology) ?Gardenia Phlegm, NP as Nurse Practitioner (Hematology and Oncology) ? ?DIAGNOSIS: No diagnosis found. ? ?SUMMARY OF ONCOLOGIC HISTORY: ?Oncology History  ?Malignant neoplasm of upper-outer quadrant of left breast in female, estrogen receptor positive ([place])  ?06/15/2017 Initial Diagnosis  ? Palpable left breast lump with bloody nipple discharge for the past 5 years extremely dense breasts 9 cm area of calcifications ultrasound 2:30 position 1.9 cm and 2:45 position 1.5 cm biopsy-proven grade 1 IDC ER 95%, PR 95%, Ki-67 25%, HER-2 negative ratio 1.1, T1c N0 stage I a clinical stage ? ?  ?07/13/2017 Genetic Testing  ? The Common Hereditary Cancer Panel offered by [facility] includes sequencing and/or deletion duplication testing of the following 47 genes: APC, ATM, AXIN2, BARD1, BMPR1A, BRCA1, BRCA2, BRIP1, CDH1, CDKN2A (p14ARF), CDKN2A (p16INK4a), CKD4, CHEK2, CTNNA1, DICER1, EPCAM (Deletion/duplication testing only), GREM1 (promoter region deletion/duplication testing only), KIT, MEN1, MLH1, MSH2, MSH3, MSH6, MUTYH, NBN, NF1, NHTL1, PALB2, PDGFRA, PMS2, POLD1, POLE, PTEN, RAD50, RAD51C, RAD51D, SDHB, SDHC, SDHD, SMAD4, SMARCA4. STK11, TP53, TSC1, TSC2, and VHL.  The following genes were evaluated for sequence changes only: SDHA and HOXB13 c.251G>A variant only. ? ?Results: Negative, no pathogenic variants identified.  The date of this test report is 07/13/2017.  ?  ?07/26/2017 Surgery  ? Bilateral mastectomies: Left mastectomy: grade 2 Multifocal IDC with DCIS,1.8 cm and 1.7 cm, multifocal DCIS intermediate grade with necrosis which is a third focus, margins negative, 0/2 lymph nodes, ER 95%, PR 95%, Ki-67 25%, HER-2 negative ratio  1.1, T1CN0 stage IA (right mastectomy benign) ? ?  ?08/04/2017 Oncotype testing  ? Oncotype DX recurrence score 18: Distant recurrence at 9 years with hormone therapy alone 5% ? ?  ?09/03/2017 -  Anti-estrogen oral therapy  ? Decided not to take it ?  ?12/02/2017 Cancer Staging  ? Staging form: Breast, AJCC 8th Edition ?- Pathologic: Stage IA (pT1c, pN0, cM0, G2, ER+, PR+, HER2-, Oncotype DX score: 18) - Signed by Gardenia Phlegm, NP on 12/02/2017 ? ?  ? ? ?CHIEF COMPLIANT: Follow-up on Antiestrogen therapy with tamoxifen  ? ?INTERVAL HISTORY: Caroline Matthews is a 31 year old with above-mentioned history of bilateral mastectomies for left breast cancer. She presents to the clinic today for a follow-up. ? ? ?ALLERGIES:  has No Known Allergies. ? ?MEDICATIONS:  ?No current outpatient medications on file.  ? ?No current facility-administered medications for this visit.  ? ? ?PHYSICAL EXAMINATION: ?ECOG PERFORMANCE STATUS: {CHL ONC ECOG HU:[id number]} ? ?There were no vitals filed for this visit. ?There were no vitals filed for this visit. ? ?BREAST:*** No palpable masses or nodules in either right or left breasts. No palpable axillary supraclavicular or infraclavicular adenopathy no breast tenderness or nipple discharge. (exam performed in the presence of a chaperone) ? ?LABORATORY DATA:  ?I have reviewed the data as listed ? ?  Latest Ref Rng & Units 07/21/2017  ? 11:27 AM 06/02/2017  ?  3:42 PM 10/20/2015  ?  7:16 AM  ?CMP  ?Glucose 65 - 99 mg/dL 97   83   112    ?BUN 6 - 20 mg/dL 9   10   9     ?Creatinine 0.44 - 1.00 mg/dL 0.75   0.73   0.40    ?Sodium 135 - 145  mmol/L 138   139   140    ?Potassium 3.5 - 5.1 mmol/L 4.2   4.1   3.4    ?Chloride 101 - 111 mmol/L 110   104   106    ?CO2 22 - 32 mmol/L 23   23     ?Calcium 8.9 - 10.3 mg/dL 8.8   9.0     ?Total Protein 6.0 - 8.5 g/dL  7.2     ?Total Bilirubin 0.0 - 1.2 mg/dL  <0.2     ?Alkaline Phos 39 - 117 IU/L  42     ?AST 0 - 40 IU/L  16     ?ALT 0 - 32  IU/L  13     ? ? ?Lab Results  ?Component Value Date  ? WBC 7.0 07/21/2017  ? HGB 12.5 07/21/2017  ? HCT 40.4 07/21/2017  ? MCV 85.6 07/21/2017  ? PLT 231 07/21/2017  ? NEUTROABS 3.8 10/20/2015  ? ? ?ASSESSMENT & PLAN:  ?No problem-specific Assessment & Plan notes found for this encounter. ? ? ? ?No orders of the defined types were placed in this encounter. ? ?The patient has a good understanding of the overall plan. she agrees with it. she will call with any problems that may develop before the next visit here. ?Total time spent: 30 mins including face to face time and time spent for planning, charting and co-ordination of care ? ? Suzzette Righter, CMA ?06/18/21 ? ? ? I Gardiner Coins am scribing for Dr. Lindi Adie ? ?***  ?

## 2021-06-18 NOTE — Telephone Encounter (Signed)
Rescheduled appointment per room resource. Patient was scheduled during [facility]. Patient is aware of the changes made to her upcoming appointment. ?

## 2021-06-19 NOTE — Progress Notes (Signed)
? ?Patient Care Team: ?Patient, No Pcp Per (Inactive) as PCP - General (General Practice) ?Rolm Bookbinder, MD as Consulting Physician (General Surgery) ?Nicholas Lose, MD as Consulting Physician (Hematology and Oncology) ?Kyung Rudd, MD as Consulting Physician (Radiation Oncology) ?Gardenia Phlegm, NP as Nurse Practitioner (Hematology and Oncology) ? ?DIAGNOSIS:  ?Encounter Diagnosis  ?Name Primary?  ? Malignant neoplasm of upper-outer quadrant of left breast in female, estrogen receptor positive ([place])   ? ? ?SUMMARY OF ONCOLOGIC HISTORY: ?Oncology History  ?Malignant neoplasm of upper-outer quadrant of left breast in female, estrogen receptor positive ([name])  ?06/15/2017 Initial Diagnosis  ? Palpable left breast lump with bloody nipple discharge for the past 5 years extremely dense breasts 9 cm area of calcifications ultrasound 2:30 position 1.9 cm and 2:45 position 1.5 cm biopsy-proven grade 1 IDC ER 95%, PR 95%, Ki-67 25%, HER-2 negative ratio 1.1, T1c N0 stage I a clinical stage ? ?  ?07/13/2017 Genetic Testing  ? The Common Hereditary Cancer Panel offered by [facility] includes sequencing and/or deletion duplication testing of the following 47 genes: APC, ATM, AXIN2, BARD1, BMPR1A, BRCA1, BRCA2, BRIP1, CDH1, CDKN2A (p14ARF), CDKN2A (p16INK4a), CKD4, CHEK2, CTNNA1, DICER1, EPCAM (Deletion/duplication testing only), GREM1 (promoter region deletion/duplication testing only), KIT, MEN1, MLH1, MSH2, MSH3, MSH6, MUTYH, NBN, NF1, NHTL1, PALB2, PDGFRA, PMS2, POLD1, POLE, PTEN, RAD50, RAD51C, RAD51D, SDHB, SDHC, SDHD, SMAD4, SMARCA4. STK11, TP53, TSC1, TSC2, and VHL.  The following genes were evaluated for sequence changes only: SDHA and HOXB13 c.251G>A variant only. ? ?Results: Negative, no pathogenic variants identified.  The date of this test report is 07/13/2017.  ?  ?07/26/2017 Surgery  ? Bilateral mastectomies: Left mastectomy: grade 2 Multifocal IDC with DCIS,1.8 cm and 1.7 cm, multifocal DCIS intermediate  grade with necrosis which is a third focus, margins negative, 0/2 lymph nodes, ER 95%, PR 95%, Ki-67 25%, HER-2 negative ratio 1.1, T1CN0 stage IA (right mastectomy benign) ? ?  ?08/04/2017 Oncotype testing  ? Oncotype DX recurrence score 18: Distant recurrence at 9 years with hormone therapy alone 5% ? ?  ?09/03/2017 -  Anti-estrogen oral therapy  ? Decided not to take it ?  ?12/02/2017 Cancer Staging  ? Staging form: Breast, AJCC 8th Edition ?- Pathologic: Stage IA (pT1c, pN0, cM0, G2, ER+, PR+, HER2-, Oncotype DX score: 18) - Signed by Gardenia Phlegm, NP on 12/02/2017 ? ?  ? ? ?CHIEF COMPLIANT: Follow-up of left breast cancer  ? ?INTERVAL HISTORY: Caroline Matthews is a 31 y.o. with above-mentioned history of left breast cancer treated with bilateral mastectomies and is currently on surveillance. She presents to the clinic today for follow-up. She state that she has been doing a lot of eating. She denies pain but some discomfort. She state her scar is tender to touch. States it feels like a shock. ? ? ?ALLERGIES:  has No Known Allergies. ?  ? ?PHYSICAL EXAMINATION: ?ECOG PERFORMANCE STATUS: 1 - Symptomatic but completely ambulatory ? ?Vitals:  ? 07/03/21 1422  ?BP: 121/86  ?Pulse: (!) 106  ?Resp: 18  ?Temp: 98.4 ?F (36.9 ?C)  ?SpO2: 100%  ? ?Filed Weights  ? 07/03/21 1422  ?Weight: 232 lb 14.4 oz (105.6 kg)  ? ? ?BREAST: No palpable lumps or nodules in bilateral chest wall or axilla (exam performed in the presence of a chaperone) ? ?LABORATORY DATA:  ?I have reviewed the data as listed ? ?  Latest Ref Rng & Units 07/21/2017  ? 11:27 AM 06/02/2017  ?  3:42 PM 10/20/2015  ?  7:16 AM  ?CMP  ?Glucose 65 - 99 mg/dL 97   83   112    ?BUN 6 - 20 mg/dL 9   10   9     ?Creatinine 0.44 - 1.00 mg/dL 0.75   0.73   0.40    ?Sodium 135 - 145 mmol/L 138   139   140    ?Potassium 3.5 - 5.1 mmol/L 4.2   4.1   3.4    ?Chloride 101 - 111 mmol/L 110   104   106    ?CO2 22 - 32 mmol/L 23   23     ?Calcium 8.9 - 10.3 mg/dL  8.8   9.0     ?Total Protein 6.0 - 8.5 g/dL  7.2     ?Total Bilirubin 0.0 - 1.2 mg/dL  <0.2     ?Alkaline Phos 39 - 117 IU/L  42     ?AST 0 - 40 IU/L  16     ?ALT 0 - 32 IU/L  13     ? ? ?Lab Results  ?Component Value Date  ? WBC 7.0 07/21/2017  ? HGB 12.5 07/21/2017  ? HCT 40.4 07/21/2017  ? MCV 85.6 07/21/2017  ? PLT 231 07/21/2017  ? NEUTROABS 3.8 10/20/2015  ? ? ?ASSESSMENT & PLAN:  ?Malignant neoplasm of upper-outer quadrant of left breast in female, estrogen receptor positive ([place]) ?07/26/2017:Bilateral mastectomies: Left mastectomy: grade 2 Multifocal IDC with DCIS,1.8 cm and 1.7 cm, multifocal DCIS intermediate grade with necrosis which is a third focus, margins negative, 0/2 lymph nodes, ER 95%, PR 95%, Ki-67 25%, HER-2 negative ratio 1.1, T1CN0 stage IA (right mastectomy benign) ?  ?Oncotype DX recurrence score 18: Risk of distant recurrence at 9 years 5% ?  ?Treatment plan: I recommended tamoxifen but she decided not to take it. ?  ?Breast cancer surveillance: ?1.  Breast exam 07/03/2021: Benign, slight palpable nodularity along the right surgical scar.   . ?2. no role of imaging because she had bilateral mastectomies ? ?She would like to be referred to healthy weight loss clinic. ?We also provided her with a referral to a counselor.  She has been stressed out and emotional. ?  ?Return to clinic in 1 year for follow-up ? ? ? ?No orders of the defined types were placed in this encounter. ? ?The patient has a good understanding of the overall plan. she agrees with it. she will call with any problems that may develop before the next visit here. ?Total time spent: 30 mins including face to face time and time spent for planning, charting and co-ordination of care ? ? Harriette Ohara, MD ?07/03/21 ? ? ? I Gardiner Coins am scribing for Dr. Lindi Adie ? ?I have reviewed the above documentation for accuracy and completeness, and I agree with the above. ?  ?

## 2021-07-02 ENCOUNTER — Ambulatory Visit: Payer: PRIVATE HEALTH INSURANCE | Admitting: Hematology and Oncology

## 2021-07-03 ENCOUNTER — Other Ambulatory Visit: Payer: Self-pay

## 2021-07-03 ENCOUNTER — Inpatient Hospital Stay: Payer: Self-pay | Attending: Hematology and Oncology | Admitting: Hematology and Oncology

## 2021-07-03 DIAGNOSIS — Z170 Estrogen receptor positive status [ER+]: Secondary | ICD-10-CM

## 2021-07-03 DIAGNOSIS — C50412 Malignant neoplasm of upper-outer quadrant of left female breast: Secondary | ICD-10-CM | POA: Insufficient documentation

## 2021-07-03 NOTE — Assessment & Plan Note (Signed)
07/26/2017:Bilateral mastectomies: Left mastectomy: grade 2 Multifocal IDC with DCIS,1.8 cm and 1.7 cm, multifocal DCIS intermediate grade with necrosis which is a third focus, margins negative, 0/2 lymph nodes, ER 95%, PR 95%, Ki-67 25%, HER-2 negative ratio 1.1, T1CN0 stage IA (right mastectomy benign) ?? ?Oncotype DX recurrence score 18: Risk of distant recurrence at 9 years 5% ?? ?Treatment plan: I recommended tamoxifen but she decided not to take it. ?? ?Breast cancer surveillance: ?1.  Breast exam 07/03/2021: Benign, slight palpable nodularity along the right surgical scar.  We will obtain ultrasound for further evaluation. ?2. no role of imaging because she had bilateral mastectomies ?? ?Return to clinic in 1 year for follow-up ?

## 2021-07-04 ENCOUNTER — Telehealth: Payer: Self-pay | Admitting: Hematology and Oncology

## 2021-07-04 ENCOUNTER — Telehealth: Payer: Self-pay | Admitting: Licensed Clinical Social Worker

## 2021-07-04 NOTE — Telephone Encounter (Signed)
Scheduled appointment per 5/11 los. Unable to leave a voicemail due to mailbox being full. Patient will be mailed an updated calendar. ?

## 2021-07-04 NOTE — Telephone Encounter (Signed)
[facility] ? ?Attempted to contact pt per referral for counseling from nurse. No answer, VM full. Unable to leave message. Will try to contact again at a later time. ? ? ?Caroline Matthews E Kylie Simmonds, LCSW ?

## 2021-07-04 NOTE — Telephone Encounter (Signed)
Multiple calls made to pt to follow-up on therapy request and ensure she has resources needed. No answer, unable to leave VM. ? ? ?Chancey Ringel E Aylana Hirschfeld, LCSW ?

## 2021-08-01 ENCOUNTER — Ambulatory Visit (INDEPENDENT_AMBULATORY_CARE_PROVIDER_SITE_OTHER): Payer: PRIVATE HEALTH INSURANCE | Admitting: Psychologist

## 2021-08-01 DIAGNOSIS — F330 Major depressive disorder, recurrent, mild: Secondary | ICD-10-CM | POA: Diagnosis not present

## 2021-08-01 NOTE — Plan of Care (Signed)
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
## 2021-08-01 NOTE — Progress Notes (Signed)
                Elgin Carn, PsyD 

## 2021-08-12 ENCOUNTER — Ambulatory Visit (INDEPENDENT_AMBULATORY_CARE_PROVIDER_SITE_OTHER): Payer: PRIVATE HEALTH INSURANCE | Admitting: Psychologist

## 2021-08-12 DIAGNOSIS — F330 Major depressive disorder, recurrent, mild: Secondary | ICD-10-CM

## 2021-08-12 NOTE — Progress Notes (Signed)
[place] Counselor/Therapist Progress Note  Patient ID: Caroline Matthews, MRN: [id number],    Date: 08/12/2021  Time Spent: 2:04 pm to 2:42 pm; total time: 38 minutes   This session was held via in person. The patient consented to in-person therapy and was in the clinician's office. Limits of confidentiality were discussed with the patient.   Treatment Type: Individual Therapy  Reported Symptoms: Depression  Mental Status Exam: Appearance:  Well Groomed     Behavior: Appropriate  Motor: Normal  Speech/Language:  Clear and Coherent  Affect: Appropriate  Mood: normal  Thought process: normal  Thought content:   WNL  Sensory/Perceptual disturbances:   WNL  Orientation: oriented to person, place, and time/date  Attention: Good  Concentration: Good  Memory: WNL  Fund of knowledge:  Good  Insight:   Poor  Judgment:  Poor  Impulse Control: Good   Risk Assessment: Danger to Self:  No Self-injurious Behavior: No Danger to Others: No Duty to Warn:no Physical Aggression / Violence:No  Access to Firearms a concern: No  Gang Involvement:No   Subjective: Beginning the session, patient described herself as doing well. After reviewing the treatment plan, patient stated "I don't know what I want to talk about today". Elaborating, she denied experiencing any current distress. She denied wanting to talk about concerns she expressed in the intake. She eventually talked about establishing boundaries with a client and reflecting on steps to establish boundaries. She reflected on thoughts and emotions associated with that idea. She was agreeable to homework. She denied suicidal and homicidal ideation.    Interventions:  Worked on developing a therapeutic relationship with the patient using active listening and reflective statements. Provided emotional support using empathy and validation. Reviewed the treatment plan with the patient. Reflected on events since the intake.  Praised the patient for doing well and explored what has assisted the patient. Explored and processed different goals for the session. Used MI to roll with the resistance. Identified working on establishing a boundary. Used socratic questions to assist the patient gain insight into self. Challenged some of the thoughts expressed. Assisted in problem solving. Used metaphors to assist the patient. Explored the feasibility of patient's plan. Assisted in identifying ways to overcome barriers. Assigned homework. Assessed for suicidal and homicidal ideation.   Homework: Establish boundary with client  Next Session: NA  Diagnosis: F33.0 major depressive affective disorder, recurrent, mild   Plan:   Goals Alleviate depressive symptoms Recognize, accept, and cope with depressive feelings Develop healthy thinking patterns Develop healthy interpersonal relationships  Objectives target date for all objectives is 08/02/2022 Cooperate with a medication evaluation by a physician Verbalize an accurate understanding of depression Verbalize an understanding of the treatment Identify and replace thoughts that support depression Learn and implement behavioral strategies Verbalize an understanding and resolution of current interpersonal problems Learn and implement problem solving and decision making skills Learn and implement conflict resolution skills to resolve interpersonal problems Verbalize an understanding of healthy and unhealthy emotions verbalize insight into how past relationships may be influence current experiences with depression Use mindfulness and acceptance strategies and increase value based behavior  Increase hopeful statements about the future.  Interventions Consistent with treatment model, discuss how change in cognitive, behavioral, and interpersonal can help client alleviate depression CBT Behavioral activation help the client explore the relationship, nature of the dispute,  Help  the client develop new interpersonal skills and relationships Conduct Problem so living therapy Teach conflict resolution skills Use a process-experiential approach Conduct  TLDP Conduct ACT Evaluate need for psychotropic medication Monitor adherence to medication   The patient and clinician reviewed the treatment plan on 08/12/2021. The patient approved of the treatment plan.   Conception Chancy, PsyD

## 2021-08-12 NOTE — Progress Notes (Signed)
                Xaniyah Buchholz, PsyD 

## 2022-07-06 ENCOUNTER — Ambulatory Visit: Payer: Self-pay | Admitting: Hematology and Oncology

## 2022-07-06 NOTE — Assessment & Plan Note (Deleted)
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   Breast cancer surveillance: 1.  Breast exam 07/06/2022: Benign, slight palpable nodularity along the right surgical scar.   . 2. no role of imaging because she had bilateral mastectomies   She would like to be referred to healthy weight loss clinic. We also provided her with a referral to a counselor.  She has been stressed out and emotional.   Return to clinic in 1 year for follow-up

## 2022-07-29 ENCOUNTER — Emergency Department (HOSPITAL_BASED_OUTPATIENT_CLINIC_OR_DEPARTMENT_OTHER)
Admission: EM | Admit: 2022-07-29 | Discharge: 2022-07-29 | Disposition: A | Discharge status: Home / Self Care | Payer: Self-pay | Attending: Emergency Medicine | Admitting: Emergency Medicine

## 2022-07-29 ENCOUNTER — Other Ambulatory Visit: Payer: Self-pay

## 2022-07-29 ENCOUNTER — Encounter (HOSPITAL_BASED_OUTPATIENT_CLINIC_OR_DEPARTMENT_OTHER): Payer: Self-pay | Admitting: Emergency Medicine

## 2022-07-29 DIAGNOSIS — N760 Acute vaginitis: Secondary | ICD-10-CM | POA: Insufficient documentation

## 2022-07-29 DIAGNOSIS — Z202 Contact with and (suspected) exposure to infections with a predominantly sexual mode of transmission: Secondary | ICD-10-CM

## 2022-07-29 DIAGNOSIS — B9689 Other specified bacterial agents as the cause of diseases classified elsewhere: Secondary | ICD-10-CM | POA: Insufficient documentation

## 2022-07-29 LAB — URINALYSIS, ROUTINE W REFLEX MICROSCOPIC
Bilirubin Urine: NEGATIVE
Glucose, UA: NEGATIVE mg/dL
Hgb urine dipstick: NEGATIVE
Ketones, ur: NEGATIVE mg/dL
Leukocytes,Ua: NEGATIVE
Nitrite: NEGATIVE
Specific Gravity, Urine: 1.027 (ref 1.005–1.030)
pH: 5.5 (ref 5.0–8.0)

## 2022-07-29 LAB — WET PREP, GENITAL
Sperm: NONE SEEN
Trich, Wet Prep: NONE SEEN
WBC, Wet Prep HPF POC: <10 (ref <10)
Yeast Wet Prep HPF POC: NONE SEEN

## 2022-07-29 LAB — PREGNANCY, URINE: Preg Test, Ur: NEGATIVE

## 2022-07-29 MED ORDER — LIDOCAINE HCL (PF) 1 % IJ SOLN: 1.0000 mL | Freq: Once | INTRAMUSCULAR | Status: DC

## 2022-07-29 MED ORDER — DOXYCYCLINE HYCLATE 100 MG PO TABS: 100.0000 mg | ORAL_TABLET | Freq: Two times a day (BID) | ORAL | 0 refills | Status: AC

## 2022-07-29 MED ORDER — METRONIDAZOLE 500 MG PO TABS
500.0000 mg | ORAL_TABLET | Freq: Once | ORAL | Status: AC
  Administered 2022-07-29: 500 mg via ORAL
  Filled 2022-07-29: qty 1

## 2022-07-29 MED ORDER — METRONIDAZOLE 500 MG PO TABS: 500.0000 mg | ORAL_TABLET | Freq: Two times a day (BID) | ORAL | 0 refills | Status: AC

## 2022-07-29 MED ORDER — VALACYCLOVIR HCL 1 G PO TABS: 500.0000 mg | ORAL_TABLET | Freq: Two times a day (BID) | ORAL | 0 refills | Status: AC

## 2022-07-29 MED ORDER — CEFTRIAXONE SODIUM 500 MG IJ SOLR: 500.0000 mg | Freq: Once | INTRAMUSCULAR | Status: DC

## 2022-07-29 NOTE — ED Provider Notes (Signed)
[place] EMERGENCY DEPARTMENT AT [facility] Provider Note   CSN: [id number] Arrival date & time: 07/29/22  1412     History  Chief Complaint  Patient presents with   Exposure to STD    Caroline Matthews is a 32 y.o. female who presents emergency department with concerns for exposure to STD.  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  Patient notes that she has noticed a bump to the vaginal area x 1 week.  No drainage to the area.  No pain to the area.  Also notes area to the buttocks that have been draining x 3-4 months.  Denies past medical history of diabetes.  Endorses concerns for thick white vaginal discharge.  Denies urinary symptoms, fever.  She notes she would like to be tested for all STDs at this time.  The history is provided by the patient. No language interpreter was used.       Home Medications Prior to Admission medications   Medication Sig Start Date End Date Taking? Authorizing Provider  doxycycline (VIBRA-TABS) 100 MG tablet Take 1 tablet (100 mg total) by mouth 2 (two) times daily for 7 days. 07/29/22 08/05/22 Yes Sabella Traore A, PA-C  metroNIDAZOLE (FLAGYL) 500 MG tablet Take 1 tablet (500 mg total) by mouth 2 (two) times daily. 07/29/22  Yes Saga Balthazar A, PA-C  valACYclovir (VALTREX) 1000 MG tablet Take 0.5 tablets (500 mg total) by mouth 2 (two) times daily for 7 days. 07/29/22 08/05/22 Yes Mercedies Ganesh A, PA-C      Allergies    Coconut (cocos nucifera)    Review of Systems   Review of Systems  All other systems reviewed and are negative.   Physical Exam Updated Vital Signs BP (!) 131/90 (BP Location: Right Arm)   Pulse 89   Temp 98.1 F (36.7 C)   Resp 18   Ht 5\' 8"  (1.727 m)   Wt 103.4 kg   SpO2 100%   BMI 34.67 kg/m  Physical Exam Vitals and nursing note reviewed. Exam conducted with a chaperone present.  Constitutional:      General: She is not in acute distress.    Appearance: Normal appearance. She is  not ill-appearing, toxic-appearing or diaphoretic.  HENT:     Head: Normocephalic and atraumatic.     Right Ear: External ear normal.     Left Ear: External ear normal.  Eyes:     General: No scleral icterus.    Extraocular Movements: Extraocular movements intact.  Cardiovascular:     Rate and Rhythm: Normal rate and regular rhythm.     Pulses: Normal pulses.     Heart sounds: Normal heart sounds.  Pulmonary:     Effort: Pulmonary effort is normal. No respiratory distress.     Breath sounds: Normal breath sounds.  Abdominal:     General: Abdomen is flat. Bowel sounds are normal. There is no distension.     Palpations: Abdomen is soft. There is no mass.     Tenderness: There is no abdominal tenderness.     Hernia: There is no hernia in the left inguinal area or right inguinal area.  Genitourinary:    Pubic Area: No rash.      Labia:        Right: No rash, tenderness, lesion or injury.        Left: No rash, tenderness, lesion or injury.      Vagina: No signs of injury and foreign  body. Vaginal discharge present. No erythema, tenderness or bleeding.     Cervix: Normal.     Uterus: Normal. Not deviated, not enlarged, not fixed and not tender.      Adnexa: Right adnexa normal and left adnexa normal.     Comments: Paramedic chaperone Coralyn Helling) present for exam.  Vaginal bleeding noted on exam.  No appreciable discharge noted.  No tenderness to palpation to bilateral adnexa.  No CMT noted.  Small nodular area noted to right labia minora measuring less than 0.05 cm.  No tenderness to palpation noted to the area.  No overlying skin changes noted to the area. Musculoskeletal:        General: Normal range of motion.     Cervical back: Normal range of motion and neck supple.  Lymphadenopathy:     Lower Body: No right inguinal adenopathy. No left inguinal adenopathy.  Skin:    General: Skin is warm and dry.  Neurological:     Mental Status: She is alert.     ED Results / Procedures  / Treatments   Labs (all labs ordered are listed, but only abnormal results are displayed) Labs Reviewed  WET PREP, GENITAL - Abnormal; Notable for the following components:      Result Value   Clue Cells Wet Prep HPF POC PRESENT (*)    All other components within normal limits  URINALYSIS, ROUTINE W REFLEX MICROSCOPIC - Abnormal; Notable for the following components:   Protein, ur TRACE (*)    All other components within normal limits  PREGNANCY, URINE  RPR  HIV ANTIBODY (ROUTINE TESTING W REFLEX)  HSV 2 ANTIBODY, IGG  HSV 1 ANTIBODY, IGG  GC/CHLAMYDIA PROBE AMP () NOT AT [facility]    EKG None  Radiology No results found.  Procedures Procedures    Medications Ordered in ED Medications  metroNIDAZOLE (FLAGYL) tablet 500 mg (has no administration in time range)    ED Course/ Medical Decision Making/ A&P Clinical Course as of 07/29/22 1806  Wed Jul 29, 2022  1702 Clue Cells Wet Prep HPF POC(!): PRESENT [SB]  1739 In depth conversation held with patient at bedside regarding treatment with Rocephin and doxycycline to treat possible exposure to gonorrhea chlamydia.  Patient notes at this time that she would like to wait for treatment until she gets her results back.  Discussed with patient that we can send [SB]    Clinical Course User Index [SB] Akesha Uresti A, PA-C                             Medical Decision Making Amount and/or Complexity of Data Reviewed Labs: ordered. Decision-making details documented in ED Course.  Risk Prescription drug management.   Patient presents to the ED with concerns for exposure to possible STD.  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  Patient does not use protection with partner.  Patient afebrile.  On exam patient with Paramedic chaperone Coralyn Helling) present for exam.  Vaginal bleeding noted on exam.  No appreciable discharge noted.  No tenderness to palpation to bilateral adnexa.  No CMT noted.  Small nodular area noted  to right labia minora measuring less than 0.05 cm.  No tenderness to palpation noted to the area.  No overlying skin changes noted to the area. Differential diagnosis includes BV, pelvic inflammatory disease, syphilis, HSV.  Labs:  I ordered, and personally interpreted labs.  The pertinent results include:  Urinalysis negative Wet prep positive for clue cells Negative pregnancy GC/chlamydia, HIV, RPR, HSV 1, HSV-2 ordered and results pending at time of discharge.    Medications:  I ordered medication including flagyl for BV treatment I have reviewed the patients home medicines and have made adjustments as needed    Disposition: Presenting suspicious for concerns for STD exposure and bacterial vaginosis.  No concerns at this time for acute cystitis, syphilis, HSV after consideration of the diagnostic results and the patients response to treatment, I feel that the patient would benefit from Discharge home.  In-depth conversation held with patient regarding treatment for gonorrhea and chlamydia.  At this time patient declines treat for gonorrhea and chlamydia at this time.  Patient voices that they would like the prescription for HSV sent to the pharmacy to take if their symptoms worsen or labs returned positive.  Patient sent with a prescription for Flagyl to treat BV today.  We will send a prescription for doxycycline due to symptoms and exposure.  Patient advised to inform and treat all sexual partners.  Pt advised on safe sex practices and understands that they have GC/Chlamydia cultures pending and will result in 2-3 days. Patient would like HIV and RPR sent today, will obtain and send. Pt encouraged to follow up at local health department for future STI checks. Supportive care measures and strict return precautions discussed with patient at bedside. Pt acknowledges and verbalizes understanding. Pt appears safe for discharge. Follow up as indicated in discharge paperwork.    This chart was  dictated using voice recognition software, Dragon. Despite the best efforts of this provider to proofread and correct errors, errors may still occur which can change documentation meaning.   Final Clinical Impression(s) / ED Diagnoses Final diagnoses:  Possible exposure to STD  BV (bacterial vaginosis)    Rx / DC Orders ED Discharge Orders          Ordered    doxycycline (VIBRA-TABS) 100 MG tablet  2 times daily        07/29/22 1704    metroNIDAZOLE (FLAGYL) 500 MG tablet  2 times daily        07/29/22 1748    valACYclovir (VALTREX) 1000 MG tablet  2 times daily        07/29/22 1748              Dorsel Flinn A, PA-C 07/29/22 1806    Lonell Grandchild, MD 07/30/22 1240

## 2022-07-29 NOTE — Discharge Instructions (Addendum)
It was a pleasure taking care of you today!  You have pending lab results for STI work-up.  You may see the results of your labs on MyChart.  You will be sent a prescription called Flagyl to treat for concerns for bacterial vaginosis that was noted today on your swab.  Do not take doxycycline. You will also be sent a prescription for Valtrex to take as directed for concerns for possible herpes exposure.  Ensure to complete the entire course of antibiotics prescribed. You may follow up with your primary care provider or Health Department if you are experiencing continued symptoms.  Return to the Emergency Department if you are experiencing increasing/worsening symptoms.

## 2022-07-29 NOTE — ED Triage Notes (Signed)
Pt arrives to ED with c/o exposure to herpes.

## 2022-07-30 LAB — HIV ANTIBODY (ROUTINE TESTING W REFLEX): HIV Screen 4th Generation wRfx: NONREACTIVE

## 2022-07-30 LAB — GC/CHLAMYDIA PROBE AMP (~~LOC~~) NOT AT ARMC
Chlamydia: NEGATIVE
Comment: NEGATIVE
Comment: NORMAL
Neisseria Gonorrhea: NEGATIVE

## 2022-07-30 LAB — RPR: RPR Ser Ql: NONREACTIVE

## 2022-07-31 LAB — HSV 2 ANTIBODY, IGG: HSV 2 Glycoprotein G Ab, IgG: <0.91 index (ref 0.00–0.90)

## 2022-07-31 LAB — HSV 1 ANTIBODY, IGG: HSV 1 Glycoprotein G Ab, IgG: 1.35 index — ABNORMAL HIGH (ref 0.00–0.90)
